# Patient Record
Sex: Female | Born: 1960 | Race: Black or African American | Hispanic: No | Smoking: Never smoker
Health system: Southern US, Community
[De-identification: ages and names within clinical notes are randomized; demographics above are authoritative.]

## PROBLEM LIST (undated history)

## (undated) DIAGNOSIS — E119 Type 2 diabetes mellitus without complications: Secondary | ICD-10-CM

## (undated) DIAGNOSIS — F32A Depression, unspecified: Secondary | ICD-10-CM

## (undated) DIAGNOSIS — E7800 Pure hypercholesterolemia, unspecified: Secondary | ICD-10-CM

## (undated) DIAGNOSIS — K219 Gastro-esophageal reflux disease without esophagitis: Secondary | ICD-10-CM

## (undated) HISTORY — DX: Type 2 diabetes mellitus without complications: E11.9

## (undated) HISTORY — PX: COLONOSCOPY: SHX174

## (undated) HISTORY — DX: Depression, unspecified: F32.A

---

## 2005-12-17 ENCOUNTER — Emergency Department: Payer: Self-pay | Admitting: General Practice

## 2007-08-08 ENCOUNTER — Emergency Department: Payer: Self-pay | Admitting: Unknown Physician Specialty

## 2007-09-02 ENCOUNTER — Emergency Department: Payer: Self-pay | Admitting: Emergency Medicine

## 2007-09-19 ENCOUNTER — Emergency Department: Payer: Self-pay | Admitting: Emergency Medicine

## 2008-08-05 ENCOUNTER — Ambulatory Visit: Payer: Self-pay | Admitting: Obstetrics and Gynecology

## 2010-03-11 ENCOUNTER — Ambulatory Visit: Payer: Self-pay | Admitting: Obstetrics and Gynecology

## 2011-03-08 ENCOUNTER — Ambulatory Visit: Payer: Self-pay | Admitting: Rheumatology

## 2011-03-17 ENCOUNTER — Ambulatory Visit: Payer: Self-pay | Admitting: Internal Medicine

## 2011-09-18 ENCOUNTER — Ambulatory Visit: Payer: Self-pay

## 2012-04-11 ENCOUNTER — Ambulatory Visit: Payer: Self-pay | Admitting: Internal Medicine

## 2013-03-06 ENCOUNTER — Ambulatory Visit: Payer: Self-pay | Admitting: Family Medicine

## 2013-04-20 ENCOUNTER — Ambulatory Visit: Payer: Self-pay | Admitting: Internal Medicine

## 2013-11-06 ENCOUNTER — Ambulatory Visit: Payer: Self-pay | Admitting: Physician Assistant

## 2015-03-06 ENCOUNTER — Ambulatory Visit
Admit: 2015-03-06 | Disposition: A | Discharge status: Home / Self Care | Payer: Self-pay | Attending: Internal Medicine | Admitting: Internal Medicine

## 2016-01-02 ENCOUNTER — Other Ambulatory Visit: Payer: Self-pay | Admitting: Internal Medicine

## 2016-01-02 DIAGNOSIS — Z1231 Encounter for screening mammogram for malignant neoplasm of breast: Secondary | ICD-10-CM

## 2016-03-08 ENCOUNTER — Ambulatory Visit: Payer: No Typology Code available for payment source | Attending: Internal Medicine

## 2016-04-27 ENCOUNTER — Ambulatory Visit: Admission: RE | Admit: 2016-04-27 | Payer: No Typology Code available for payment source | Source: Ambulatory Visit

## 2017-01-25 ENCOUNTER — Other Ambulatory Visit: Payer: Self-pay | Admitting: Internal Medicine

## 2017-01-25 DIAGNOSIS — Z1231 Encounter for screening mammogram for malignant neoplasm of breast: Secondary | ICD-10-CM

## 2017-03-09 ENCOUNTER — Ambulatory Visit
Admission: RE | Admit: 2017-03-09 | Discharge: 2017-03-09 | Disposition: A | Discharge status: Home / Self Care | Payer: No Typology Code available for payment source | Source: Ambulatory Visit | Attending: Internal Medicine | Admitting: Internal Medicine

## 2017-03-09 DIAGNOSIS — Z1231 Encounter for screening mammogram for malignant neoplasm of breast: Secondary | ICD-10-CM

## 2017-12-29 DIAGNOSIS — E1142 Type 2 diabetes mellitus with diabetic polyneuropathy: Secondary | ICD-10-CM | POA: Insufficient documentation

## 2017-12-29 DIAGNOSIS — E559 Vitamin D deficiency, unspecified: Secondary | ICD-10-CM | POA: Insufficient documentation

## 2017-12-29 DIAGNOSIS — E785 Hyperlipidemia, unspecified: Secondary | ICD-10-CM

## 2017-12-29 DIAGNOSIS — Z794 Long term (current) use of insulin: Secondary | ICD-10-CM

## 2017-12-29 DIAGNOSIS — E1169 Type 2 diabetes mellitus with other specified complication: Secondary | ICD-10-CM | POA: Insufficient documentation

## 2018-02-11 ENCOUNTER — Ambulatory Visit
Admission: EM | Admit: 2018-02-11 | Discharge: 2018-02-11 | Disposition: A | Discharge status: Home / Self Care | Payer: No Typology Code available for payment source | Attending: Emergency Medicine | Admitting: Emergency Medicine

## 2018-02-11 ENCOUNTER — Other Ambulatory Visit: Payer: Self-pay

## 2018-02-11 DIAGNOSIS — B029 Zoster without complications: Secondary | ICD-10-CM | POA: Diagnosis not present

## 2018-02-11 DIAGNOSIS — R21 Rash and other nonspecific skin eruption: Secondary | ICD-10-CM | POA: Diagnosis not present

## 2018-02-11 HISTORY — DX: Type 2 diabetes mellitus without complications: E11.9

## 2018-02-11 HISTORY — DX: Pure hypercholesterolemia, unspecified: E78.00

## 2018-02-11 HISTORY — DX: Gastro-esophageal reflux disease without esophagitis: K21.9

## 2018-02-11 MED ORDER — VALACYCLOVIR HCL 1 G PO TABS: 1000.0000 mg | ORAL_TABLET | Freq: Three times a day (TID) | ORAL | 0 refills | Status: AC

## 2018-02-11 MED ORDER — LIDOCAINE 5 % EX OINT: 1.0000 "application " | TOPICAL_OINTMENT | CUTANEOUS | 0 refills | Status: DC | PRN

## 2018-02-11 NOTE — ED Provider Notes (Signed)
[facility]    CSN: [id number] Arrival date & time: 02/11/18  1018     History   Chief Complaint Chief Complaint  Patient presents with  . Rash    HPI Kristin Cruz is a 57 y.o. female.   HPI  57 year old female who presents with a rash that is painful in the left upper back started about 1 week ago.  The pain has been radiating into her left axilla.  Had pain in her leg her neck which are not associated.  She has had chickenpox in childhood along with the other childhood illnesses.  Has had no fever or chills.  Does have a history of diabetes.  She has no respiratory symptoms.       Past Medical History:  Diagnosis Date  . Diabetes mellitus without complication (HCC)   . GERD (gastroesophageal reflux disease)   . High cholesterol     There are no active problems to display for this patient.   History reviewed. No pertinent surgical history.  OB History   None      Home Medications    Prior to Admission medications   Medication Sig Start Date End Date Taking? Authorizing Provider  insulin degludec (TRESIBA FLEXTOUCH) 100 UNIT/ML SOPN FlexTouch Pen Inject into the skin. 12/29/17  Yes [provider]  metFORMIN (GLUCOPHAGE) 500 MG tablet Take by mouth 2 (two) times daily with a meal.   Yes [provider]  rosuvastatin (CRESTOR) 10 MG tablet Take by mouth. 12/29/17  Yes [provider]  aspirin 81 MG chewable tablet Chew by mouth.    [provider]  glimepiride (AMARYL) 4 MG tablet Take by mouth.    [provider]  lidocaine (XYLOCAINE) 5 % ointment Apply 1 application topically as needed. 02/11/18   Lutricia Feiloemer, Brad Mcgaughy P, PA-C  valACYclovir (VALTREX) 1000 MG tablet Take 1 tablet (1,000 mg total) by mouth 3 (three) times daily for 7 days. 02/11/18 02/18/18  Lutricia Feiloemer, Penina Reisner P, PA-C    Family History Family History  Problem Relation Age of Onset  . Breast cancer Neg Hx     Social History Social  History   Tobacco Use  . Smoking status: Never Smoker  . Smokeless tobacco: Current User    Types: Snuff  Substance Use Topics  . Alcohol use: Not Currently  . Drug use: Never     Allergies   Patient has no known allergies.   Review of Systems Review of Systems  Constitutional: Positive for activity change. Negative for chills, fatigue and fever.  Skin: Positive for rash.  All other systems reviewed and are negative.    Physical Exam Triage Vital Signs ED Triage Vitals  Enc Vitals Group     BP 02/11/18 1040 115/66     Pulse Rate 02/11/18 1040 92     Resp 02/11/18 1040 16     Temp 02/11/18 1040 98.1 F (36.7 C)     Temp Source 02/11/18 1040 Oral     SpO2 02/11/18 1040 100 %     Weight 02/11/18 1041 121 lb (54.9 kg)     Height 02/11/18 1041 5' 1.25" (1.556 m)     Head Circumference --      Peak Flow --      Pain Score 02/11/18 1041 8     Pain Loc --      Pain Edu? --      Excl. in GC? --    No data found.  Updated Vital  Signs BP 115/66 (BP Location: Right Arm)   Pulse 92   Temp 98.1 F (36.7 C) (Oral)   Resp 16   Ht 5' 1.25" (1.556 m)   Wt 121 lb (54.9 kg)   SpO2 100%   BMI 22.68 kg/m   Visual Acuity Right Eye Distance:   Left Eye Distance:   Bilateral Distance:    Right Eye Near:   Left Eye Near:    Bilateral Near:     Physical Exam  Constitutional: She is oriented to person, place, and time. She appears well-developed and well-nourished. No distress.  HENT:  Head: Normocephalic.  Eyes: Pupils are equal, round, and reactive to light. Right eye exhibits no discharge. Left eye exhibits no discharge.  Neck: Normal range of motion. Neck supple.  Musculoskeletal: Normal range of motion. She exhibits edema and tenderness.  Neurological: She is alert and oriented to person, place, and time.  Skin: Skin is warm and dry. Rash noted. She is not diaphoretic.  Psychiatric: She has a normal mood and affect. Her behavior is normal. Judgment and thought  content normal.  Nursing note and vitals reviewed.        UC Treatments / Results  Labs (all labs ordered are listed, but only abnormal results are displayed) Labs Reviewed - No data to display  EKG None Radiology No results found.  Procedures Procedures (including critical care time)  Medications Ordered in UC Medications - No data to display   Initial Impression / Assessment and Plan / UC Course  I have reviewed the triage vital signs and the nursing notes.  Pertinent labs & imaging results that were available during my care of the patient were reviewed by me and considered in my medical decision making (see chart for details).     Plan: 1. Test/x-ray results and diagnosis reviewed with patient 2. rx as per orders; risks, benefits, potential side effects reviewed with patient 3. Recommend supportive treatment with use of tracts even though it is out of the window effort to try to prevent postherpetic neuralgia.  Given her lidocaine 5% ointment that she can apply to the area for topical pain relief.  Also recommend that she take Tylenol or Motrin for pain control.  I recommended she follow-up in 1 week with her primary care physician for ongoing care and monitoring. 4. F/u prn if symptoms worsen or don't improve   Final Clinical Impressions(s) / UC Diagnoses   Final diagnoses:  Herpes zoster without complication    ED Discharge Orders        Ordered    valACYclovir (VALTREX) 1000 MG tablet  3 times daily     02/11/18 1154    lidocaine (XYLOCAINE) 5 % ointment  As needed     02/11/18 1155       Controlled Substance Prescriptions [place] Controlled Substance Registry consulted? Not Applicable   Lutricia Feil, PA-C 02/11/18 1201

## 2018-02-11 NOTE — ED Triage Notes (Signed)
Painful left upper back rash starting last Saturday

## 2018-02-23 ENCOUNTER — Other Ambulatory Visit: Payer: Self-pay | Admitting: Nurse Practitioner

## 2018-02-23 DIAGNOSIS — Z1231 Encounter for screening mammogram for malignant neoplasm of breast: Secondary | ICD-10-CM

## 2018-03-13 ENCOUNTER — Ambulatory Visit: Payer: No Typology Code available for payment source

## 2018-03-14 ENCOUNTER — Inpatient Hospital Stay: Admission: RE | Admit: 2018-03-14 | Payer: No Typology Code available for payment source | Source: Ambulatory Visit

## 2018-03-20 ENCOUNTER — Inpatient Hospital Stay: Admission: RE | Admit: 2018-03-20 | Payer: No Typology Code available for payment source | Source: Ambulatory Visit

## 2018-03-27 ENCOUNTER — Other Ambulatory Visit: Payer: Self-pay | Admitting: Nurse Practitioner

## 2018-03-27 ENCOUNTER — Ambulatory Visit
Admission: RE | Admit: 2018-03-27 | Discharge: 2018-03-27 | Disposition: A | Discharge status: Home / Self Care | Payer: No Typology Code available for payment source | Source: Ambulatory Visit | Attending: Nurse Practitioner | Admitting: Nurse Practitioner

## 2018-03-27 DIAGNOSIS — Z1231 Encounter for screening mammogram for malignant neoplasm of breast: Secondary | ICD-10-CM

## 2018-05-27 ENCOUNTER — Other Ambulatory Visit: Payer: Self-pay

## 2018-05-27 ENCOUNTER — Ambulatory Visit
Admission: EM | Admit: 2018-05-27 | Discharge: 2018-05-27 | Disposition: A | Discharge status: Home / Self Care | Payer: No Typology Code available for payment source | Attending: Emergency Medicine | Admitting: Emergency Medicine

## 2018-05-27 DIAGNOSIS — R350 Frequency of micturition: Secondary | ICD-10-CM

## 2018-05-27 DIAGNOSIS — B373 Candidiasis of vulva and vagina: Secondary | ICD-10-CM | POA: Insufficient documentation

## 2018-05-27 DIAGNOSIS — Z7982 Long term (current) use of aspirin: Secondary | ICD-10-CM | POA: Insufficient documentation

## 2018-05-27 DIAGNOSIS — R358 Other polyuria: Secondary | ICD-10-CM | POA: Diagnosis not present

## 2018-05-27 DIAGNOSIS — Z7984 Long term (current) use of oral hypoglycemic drugs: Secondary | ICD-10-CM | POA: Insufficient documentation

## 2018-05-27 DIAGNOSIS — B3731 Acute candidiasis of vulva and vagina: Secondary | ICD-10-CM

## 2018-05-27 DIAGNOSIS — E119 Type 2 diabetes mellitus without complications: Secondary | ICD-10-CM | POA: Diagnosis not present

## 2018-05-27 DIAGNOSIS — E1165 Type 2 diabetes mellitus with hyperglycemia: Secondary | ICD-10-CM | POA: Diagnosis not present

## 2018-05-27 DIAGNOSIS — R631 Polydipsia: Secondary | ICD-10-CM | POA: Diagnosis not present

## 2018-05-27 DIAGNOSIS — Z79899 Other long term (current) drug therapy: Secondary | ICD-10-CM | POA: Insufficient documentation

## 2018-05-27 DIAGNOSIS — K219 Gastro-esophageal reflux disease without esophagitis: Secondary | ICD-10-CM | POA: Insufficient documentation

## 2018-05-27 LAB — URINALYSIS, COMPLETE (UACMP) WITH MICROSCOPIC
Bacteria, UA: NONE SEEN
Bilirubin Urine: NEGATIVE
Glucose, UA: >1000 mg/dL — AB
HGB URINE DIPSTICK: NEGATIVE
Ketones, ur: NEGATIVE mg/dL
NITRITE: NEGATIVE
PH: 5.5 (ref 5.0–8.0)
PROTEIN: NEGATIVE mg/dL
RBC / HPF: NONE SEEN RBC/hpf (ref 0–5)
SPECIFIC GRAVITY, URINE: 1.01 (ref 1.005–1.030)

## 2018-05-27 LAB — COMPREHENSIVE METABOLIC PANEL
ALBUMIN: 4.2 g/dL (ref 3.5–5.0)
ALT: 17 U/L (ref 0–44)
AST: 21 U/L (ref 15–41)
Alkaline Phosphatase: 98 U/L (ref 38–126)
Anion gap: 13 (ref 5–15)
BUN: 10 mg/dL (ref 6–20)
CO2: 23 mmol/L (ref 22–32)
Calcium: 9.2 mg/dL (ref 8.9–10.3)
Chloride: 96 mmol/L — ABNORMAL LOW (ref 98–111)
Creatinine, Ser: 0.61 mg/dL (ref 0.44–1.00)
GFR calc Af Amer: >60 mL/min (ref >60)
GFR calc non Af Amer: >60 mL/min (ref >60)
GLUCOSE: 351 mg/dL — AB (ref 70–99)
Potassium: 3.8 mmol/L (ref 3.5–5.1)
Sodium: 132 mmol/L — ABNORMAL LOW (ref 135–145)
Total Bilirubin: 1.5 mg/dL — ABNORMAL HIGH (ref 0.3–1.2)
Total Protein: 7.7 g/dL (ref 6.5–8.1)

## 2018-05-27 LAB — GLUCOSE, CAPILLARY: Glucose-Capillary: 330 mg/dL — ABNORMAL HIGH (ref 70–99)

## 2018-05-27 MED ORDER — FLUCONAZOLE 150 MG PO TABS: 150.0000 mg | ORAL_TABLET | Freq: Once | ORAL | 1 refills | Status: AC

## 2018-05-27 NOTE — Discharge Instructions (Addendum)
Your labs came back okay, except for the yeast in your urine and the high blood sugar.  Increase your metformin by adding 500 mg of metformin in the night with dinner.  Continue the thousand milligrams of metformin in the morning.  I am also treating you for a vaginal yeast infection With Diflucan. call your endocrinologist on Monday and get an appointment as soon as you possibly can.  I am have sent off a hemoglobin A1c as a courtesy, he should be able to see these results in epic.  Go immediately to the ER if your sugar reads high or if it is unreadable in your glucose meter, chest pain, chest pressure or heaviness, or any other concerns

## 2018-05-27 NOTE — ED Triage Notes (Signed)
Pt is c/o "sweet smelling" urine x 1 month. She has DM, and [facility]. Denies recent DM med changes. Also c/o SOB, weakness, fatigue.

## 2018-05-27 NOTE — ED Provider Notes (Signed)
HPI  SUBJECTIVE:  Kristin Cruz is a 57 y.o. female who presents with "sweet smelling urine" for the past month.  She reports urinary frequency, polyuria, polydipsia during this time.  She denies dysuria, urgency, cloudy urine, hematuria.  No unintentional weight loss, blurry vision, abdominal, back, pelvic pain.  No fevers, nausea, body aches.  She also reports vaginal itching and nonodorous vaginal discharge.  No aggravating or alleviating factors.  She has not tried anything for her symptoms.  She has not checked her sugars in 5 days, states that it was in the 300s.  She states that her baseline is somewhere between 300-400 and has been this way for "years".  She states that she is not sure how to take her insulin. Patient has a past medical history of diabetes, on metformin 1000 milligrams once daily, glimepiride, 30 units of Tresiba insulin daily.  She also has a past medical history of lower extremity neuropathy, GERD.  No history of DKA, HHNK, UTI, pyelonephritis. No record of PCP visit 2 months ago.  Last visit was with endocrinology in February. PMD: Nira Retort.  Endocrinologist: Dr. Gershon Crane.  Past Medical History:  Diagnosis Date  . Diabetes mellitus without complication (HCC)   . GERD (gastroesophageal reflux disease)   . High cholesterol     History reviewed. No pertinent surgical history.  Family History  Problem Relation Age of Onset  . Breast cancer Neg Hx     Social History   Tobacco Use  . Smoking status: Never Smoker  . Smokeless tobacco: Current User    Types: Snuff  Substance Use Topics  . Alcohol use: Not Currently  . Drug use: Never    No current facility-administered medications for this encounter.   Current Outpatient Medications:  .  aspirin 81 MG chewable tablet, Chew by mouth., Disp: , Rfl:  .  glimepiride (AMARYL) 4 MG tablet, Take by mouth., Disp: , Rfl:  .  insulin degludec (TRESIBA FLEXTOUCH) 100 UNIT/ML SOPN FlexTouch Pen,  Inject into the skin., Disp: , Rfl:  .  metFORMIN (GLUCOPHAGE) 500 MG tablet, Take by mouth 2 (two) times daily with a meal., Disp: , Rfl:  .  omeprazole (PRILOSEC) 40 MG capsule, TAKE 1 TABLET ONCE A DAY 30 MINUTES BEFORE MEAL, Disp: , Rfl: 3 .  rosuvastatin (CRESTOR) 10 MG tablet, Take by mouth., Disp: , Rfl:  .  Vitamin D, Ergocalciferol, (DRISDOL) 50000 units CAPS capsule, TAKE ONE CAPSULE BY MOUTH WEEKLY FOR LOW VITAMIN D, Disp: , Rfl: 3 .  lidocaine (XYLOCAINE) 5 % ointment, Apply 1 application topically as needed., Disp: 35.44 g, Rfl: 0  No Known Allergies   ROS  As noted in HPI.   Physical Exam  BP 112/64 (BP Location: Left Arm)   Pulse 90   Temp 98.3 F (36.8 C) (Oral)   Resp 16   Ht 5' 1.25" (1.556 m)   Wt 123 lb (55.8 kg)   SpO2 100%   BMI 23.05 kg/m   Constitutional: Well developed, well nourished, no acute distress Eyes:  EOMI, conjunctiva normal bilaterally HENT: Normocephalic, atraumatic,mucus membranes moist Respiratory: Normal inspiratory effort lungs clear bilaterally Cardiovascular: Normal rate regular rhythm, no murmurs, rubs, gallops GI: nondistended soft, nontender.  Active bowel sounds.  No guarding, rebound.  No suprapubic or flank tenderness Back: No CVA tenderness skin: No rash, skin intact Musculoskeletal: no deformities Neurologic: Alert & oriented x 3, no focal neuro deficits Psychiatric: Speech and behavior appropriate   ED Course   Medications -  No data to display  Orders Placed This Encounter  Procedures  . Urine culture    Standing Status:   Standing    Number of Occurrences:   1  . Urinalysis, Complete w Microscopic    Standing Status:   Standing    Number of Occurrences:   1  . Glucose, capillary    Standing Status:   Standing    Number of Occurrences:   1  . Comprehensive metabolic panel    Standing Status:   Standing    Number of Occurrences:   1  . Hemoglobin A1c    Standing Status:   Standing    Number of Occurrences:    1    Results for orders placed or performed during the hospital encounter of 05/27/18 (from the past 24 hour(s))  Urinalysis, Complete w Microscopic     Status: Abnormal   Collection Time: 05/27/18  8:20 AM  Result Value Ref Range   Color, Urine YELLOW YELLOW   APPearance CLOUDY (A) CLEAR   Specific Gravity, Urine 1.010 1.005 - 1.030   pH 5.5 5.0 - 8.0   Glucose, UA >1000 (A) NEGATIVE mg/dL   Hgb urine dipstick NEGATIVE NEGATIVE   Bilirubin Urine NEGATIVE NEGATIVE   Ketones, ur NEGATIVE NEGATIVE mg/dL   Protein, ur NEGATIVE NEGATIVE mg/dL   Nitrite NEGATIVE NEGATIVE   Leukocytes, UA SMALL (A) NEGATIVE   Squamous Epithelial / LPF 6-10 0 - 5   WBC, UA 6-10 0 - 5 WBC/hpf   RBC / HPF NONE SEEN 0 - 5 RBC/hpf   Bacteria, UA NONE SEEN NONE SEEN   Budding Yeast 3+   Glucose, capillary     Status: Abnormal   Collection Time: 05/27/18  8:32 AM  Result Value Ref Range   Glucose-Capillary 330 (H) 70 - 99 mg/dL  Comprehensive metabolic panel     Status: Abnormal   Collection Time: 05/27/18  8:51 AM  Result Value Ref Range   Sodium 132 (L) 135 - 145 mmol/L   Potassium 3.8 3.5 - 5.1 mmol/L   Chloride 96 (L) 98 - 111 mmol/L   CO2 23 22 - 32 mmol/L   Glucose, Bld 351 (H) 70 - 99 mg/dL   BUN 10 6 - 20 mg/dL   Creatinine, Ser 9.56 0.44 - 1.00 mg/dL   Calcium 9.2 8.9 - 21.3 mg/dL   Total Protein 7.7 6.5 - 8.1 g/dL   Albumin 4.2 3.5 - 5.0 g/dL   AST 21 15 - 41 U/L   ALT 17 0 - 44 U/L   Alkaline Phosphatase 98 38 - 126 U/L   Total Bilirubin 1.5 (H) 0.3 - 1.2 mg/dL   GFR calc non Af Amer >60 >60 mL/min   GFR calc Af Amer >60 >60 mL/min   Anion gap 13 5 - 15   No results found.  ED Clinical Impression  No diagnosis found.   ED Assessment/Plan  Outside records reviewed.  As noted in HPI.  His primary complaint was sweet smelling urine, polyuria polydipsia. Also c/o vaginal itching, d/c.  She did not mention any other complaints to me.  Fingerstick 330.  We will check a UA, CMP,  make sure that she is not acidotic, and and A1c as courtesy to her endocrinologist.    UA with over thousand glucose, small leukocytes, but it is a contaminated specimen.  Also 3+ yeast.  Sending urine off for culture to confirm absence of UTI and will base treatment decisions on the  culture.  Other than the Hyperglycemia, CMP normal.  Normal anion gap.  She is not in DKA.  1.  Uncontrolled diabetes.  Will add an additional 500 mg of metformin at night.  She is to continue thousand milligrams of metformin in the morning.  States that she does not need a prescription of metformin.  She states that she will call her endocrinologist on Monday for further management.  2.  Vaginal yeast infection.  Sending home with Diflucan.  Discussed labs, MDM, treatment plan, and plan for follow-up with patient. Discussed sn/sx that should prompt return to the ED. patient agrees with plan.   No orders of the defined types were placed in this encounter.   *This clinic note was created using Dragon dictation software. Therefore, there may be occasional mistakes despite careful proofreading.   ?   Domenick GongMortenson, Minyon Billiter, MD 05/27/18 1720

## 2018-05-28 LAB — HEMOGLOBIN A1C
Hgb A1c MFr Bld: 9.6 % — ABNORMAL HIGH (ref 4.8–5.6)
MEAN PLASMA GLUCOSE: 228.82 mg/dL

## 2018-05-29 LAB — URINE CULTURE

## 2018-05-30 ENCOUNTER — Telehealth (HOSPITAL_COMMUNITY): Payer: Self-pay

## 2018-05-30 NOTE — Telephone Encounter (Signed)
A1C is elevated. Per Dr. Tracie HarrierHagler patient should follow up with PCP. Attempted to reach patient. No answer at this time.

## 2018-06-08 ENCOUNTER — Other Ambulatory Visit: Payer: Self-pay

## 2018-06-08 ENCOUNTER — Ambulatory Visit (INDEPENDENT_AMBULATORY_CARE_PROVIDER_SITE_OTHER): Payer: No Typology Code available for payment source | Admitting: Family Medicine

## 2018-06-08 ENCOUNTER — Encounter: Payer: Self-pay | Admitting: Family Medicine

## 2018-06-08 VITALS — BP 92/68 | HR 91 | Resp 16 | Ht 61.25 in | Wt 124.0 lb

## 2018-06-08 DIAGNOSIS — Z794 Long term (current) use of insulin: Secondary | ICD-10-CM | POA: Diagnosis not present

## 2018-06-08 DIAGNOSIS — E1169 Type 2 diabetes mellitus with other specified complication: Secondary | ICD-10-CM

## 2018-06-08 DIAGNOSIS — E1142 Type 2 diabetes mellitus with diabetic polyneuropathy: Secondary | ICD-10-CM | POA: Diagnosis not present

## 2018-06-08 DIAGNOSIS — E785 Hyperlipidemia, unspecified: Secondary | ICD-10-CM

## 2018-06-08 DIAGNOSIS — Z7689 Persons encountering health services in other specified circumstances: Secondary | ICD-10-CM

## 2018-06-08 DIAGNOSIS — E782 Mixed hyperlipidemia: Secondary | ICD-10-CM

## 2018-06-08 MED ORDER — METFORMIN HCL 500 MG PO TABS: 500.0000 mg | ORAL_TABLET | Freq: Two times a day (BID) | ORAL | 3 refills | Status: DC

## 2018-06-08 MED ORDER — GLIMEPIRIDE 4 MG PO TABS: 4.0000 mg | ORAL_TABLET | ORAL | 3 refills | Status: DC

## 2018-06-08 MED ORDER — ROSUVASTATIN CALCIUM 10 MG PO TABS: 10.0000 mg | ORAL_TABLET | Freq: Every day | ORAL | 3 refills | Status: DC

## 2018-06-08 MED ORDER — INSULIN DEGLUDEC 100 UNIT/ML ~~LOC~~ SOPN: 30.0000 [IU] | PEN_INJECTOR | Freq: Every day | SUBCUTANEOUS | 3 refills | Status: DC

## 2018-06-08 NOTE — Addendum Note (Signed)
Addended by: Duanne LimerickJONES, Donnie Panik C on: 06/08/2018 04:28 PM   Modules accepted: Level of Service

## 2018-06-08 NOTE — Progress Notes (Addendum)
Name: Kristin Cruz   MRN: [id number]    DOB: September 08, 1961   Date:06/08/2018       Progress Note  Subjective  Chief Complaint  Chief Complaint  Patient presents with  . Establish Care    Was seeing [facility] End[facility] and did not like him. Did not mesh well. BS ranges in 200-220s Low 63 Tuesday     Diabetes  She presents for her follow-up diabetic visit. She has type 2 diabetes mellitus. Her disease course has been fluctuating. Pertinent negatives for hypoglycemia include no confusion, dizziness, headaches, hunger, mood changes, nervousness/anxiousness, pallor, seizures, sleepiness, speech difficulty or sweats. Associated symptoms include polydipsia and polyuria. Pertinent negatives for diabetes include no blurred vision, no chest pain, no fatigue, no foot paresthesias, no foot ulcerations, no polyphagia, no visual change, no weakness and no weight loss. There are no hypoglycemic complications. Symptoms are improving. There are no diabetic complications. Risk factors for coronary artery disease include diabetes mellitus and dyslipidemia. Current diabetic treatment includes oral agent (dual therapy) and insulin injections. She is following a generally healthy diet. Her home blood glucose trend is fluctuating minimally. An ACE inhibitor/angiotensin II receptor blocker is not being taken.  Hyperlipidemia  This is a chronic problem. The problem is uncontrolled. Exacerbating diseases include diabetes. She has no history of chronic renal disease. Pertinent negatives include no chest pain, focal sensory loss, focal weakness, leg pain, myalgias or shortness of breath. Current antihyperlipidemic treatment includes statins. The current treatment provides moderate improvement of lipids. There are no compliance problems.  Risk factors for coronary artery disease include dyslipidemia and diabetes mellitus.    Type 2 diabetes mellitus with diabetic polyneuropathy, with long-term current use of insulin  (HCC) Resumed glimepiride 4mg  daily, metformen 500 bid, and tesiba 30 units. rechek in 3 weeks.  Hyperlipidemia due to type 2 diabetes mellitus (HCC) Continue rosuvastatin 10 mg daily    Past Medical History:  Diagnosis Date  . Diabetes mellitus without complication (HCC)   . GERD (gastroesophageal reflux disease)   . High cholesterol     History reviewed. No pertinent surgical history.  Family History  Problem Relation Age of Onset  . Breast cancer Neg Hx     Social History   Socioeconomic History  . Marital status: Legally Separated    Spouse name: Not on file  . Number of children: Not on file  . Years of education: Not on file  . Highest education level: Not on file  Occupational History  . Not on file  Social Needs  . Financial resource strain: Not on file  . Food insecurity:    Worry: Not on file    Inability: Not on file  . Transportation needs:    Medical: Not on file    Non-medical: Not on file  Tobacco Use  . Smoking status: Never Smoker  . Smokeless tobacco: Current User    Types: Snuff  Substance and Sexual Activity  . Alcohol use: Not Currently  . Drug use: Never  . Sexual activity: Not on file  Lifestyle  . Physical activity:    Days per week: Not on file    Minutes per session: Not on file  . Stress: Not on file  Relationships  . Social connections:    Talks on phone: Not on file    Gets together: Not on file    Attends religious service: Not on file    Active member of club or organization: Not on file  Attends meetings of clubs or organizations: Not on file    Relationship status: Not on file  . Intimate partner violence:    Fear of current or ex partner: Not on file    Emotionally abused: Not on file    Physically abused: Not on file    Forced sexual activity: Not on file  Other Topics Concern  . Not on file  Social History Narrative  . Not on file    No Known Allergies  Outpatient Medications Prior to Visit  Medication Sig  Dispense Refill  . aspirin 81 MG chewable tablet Chew by mouth.    Marland Kitchen. FREESTYLE INSULINX TEST test strip USE WITH DEVICE TO CHECK SUGARS TWICE DAILY  3  . metFORMIN (GLUCOPHAGE-XR) 500 MG 24 hr tablet Take 1,000 mg by mouth.     . naproxen sodium (ANAPROX) 550 MG tablet   0  . omeprazole (PRILOSEC) 20 MG capsule Take by mouth.    Marland Kitchen. omeprazole (PRILOSEC) 40 MG capsule TAKE 1 TABLET ONCE A DAY 30 MINUTES BEFORE MEAL  3  . Vitamin D, Ergocalciferol, (DRISDOL) 50000 units CAPS capsule TAKE ONE CAPSULE BY MOUTH WEEKLY FOR LOW VITAMIN D  3  . gabapentin (NEURONTIN) 300 MG capsule Take 300 mg by mouth.   0  . glimepiride (AMARYL) 4 MG tablet Take by mouth.    . insulin degludec (TRESIBA FLEXTOUCH) 100 UNIT/ML SOPN FlexTouch Pen Inject 30 Units into the skin.     . metFORMIN (GLUCOPHAGE) 500 MG tablet Take by mouth 2 (two) times daily with a meal.    . rosuvastatin (CRESTOR) 10 MG tablet Take 10 mg by mouth daily.     . fluconazole (DIFLUCAN) 150 MG tablet TAKE 1 TABLET ONCE FOR 1 DOSE...MAY REPEAT IN 72 HOURS IF NO IMPROVEMENT  1  . lidocaine (XYLOCAINE) 5 % ointment Apply 1 application topically as needed. 35.44 g 0   No facility-administered medications prior to visit.     Review of Systems  Constitutional: Negative for chills, fatigue, fever, malaise/fatigue and weight loss.  HENT: Negative for ear discharge, ear pain and sore throat.   Eyes: Negative for blurred vision.  Respiratory: Negative for cough, sputum production, shortness of breath and wheezing.   Cardiovascular: Negative for chest pain, palpitations and leg swelling.  Gastrointestinal: Negative for abdominal pain, blood in stool, constipation, diarrhea, heartburn, melena and nausea.  Genitourinary: Negative for dysuria, frequency, hematuria and urgency.  Musculoskeletal: Negative for back pain, joint pain, myalgias and neck pain.  Skin: Negative for pallor and rash.  Neurological: Negative for dizziness, tingling, sensory change,  focal weakness, seizures, speech difficulty, weakness and headaches.  Endo/Heme/Allergies: Positive for polydipsia. Negative for environmental allergies and polyphagia. Does not bruise/bleed easily.  Psychiatric/Behavioral: Negative for confusion, depression and suicidal ideas. The patient is not nervous/anxious and does not have insomnia.      Objective  Vitals:   06/08/18 1507  BP: 92/68  Pulse: 91  Resp: 16  SpO2: 98%  Weight: 124 lb (56.2 kg)  Height: 5' 1.25" (1.556 m)    Physical Exam  Constitutional: She is oriented to person, place, and time. She appears well-developed and well-nourished.  HENT:  Head: Normocephalic.  Right Ear: External ear normal.  Left Ear: External ear normal.  Mouth/Throat: Oropharynx is clear and moist.  Eyes: Pupils are equal, round, and reactive to light. Conjunctivae and EOM are normal. Lids are everted and swept, no foreign bodies found. Left eye exhibits no hordeolum. No foreign body present  in the left eye. Right conjunctiva is not injected. Left conjunctiva is not injected. No scleral icterus.  Neck: Normal range of motion. Neck supple. No JVD present. No tracheal deviation present. No thyromegaly present.  Cardiovascular: Normal rate, regular rhythm, normal heart sounds and intact distal pulses. Exam reveals no gallop and no friction rub.  No murmur heard. Pulmonary/Chest: Effort normal and breath sounds normal. No respiratory distress. She has no wheezes. She has no rales.  Abdominal: Soft. Bowel sounds are normal. She exhibits no mass. There is no hepatosplenomegaly. There is no tenderness. There is no rebound and no guarding.  Musculoskeletal: Normal range of motion. She exhibits no edema or tenderness.  Lymphadenopathy:    She has no cervical adenopathy.  Neurological: She is alert and oriented to person, place, and time. She has normal strength. She displays normal reflexes. No cranial nerve deficit.  Skin: Skin is warm. No rash noted.   Psychiatric: She has a normal mood and affect. Her mood appears not anxious. She does not exhibit a depressed mood.  Nursing note and vitals reviewed.     Assessment & Plan  Problem List Items Addressed This Visit      Endocrine   Hyperlipidemia due to type 2 diabetes mellitus (HCC)    Continue rosuvastatin 10 mg daily       Type 2 diabetes mellitus with diabetic polyneuropathy, with long-term current use of insulin (HCC)    Resumed glimepiride 4mg  daily, metformen 500 bid, and tesiba 30 units. rechek in 3 weeks.       Other Visit Diagnoses    Establishing care with new doctor, encounter for    -  Primary   establish care with PCP Meds reestablished rechecki in 3 weeks      Meds ordered this encounter  Medications  . DISCONTD: glimepiride (AMARYL) 4 MG tablet    Sig: Take 1 tablet (4 mg total) by mouth 1 day or 1 dose.    Dispense:  30 tablet    Refill:  3  . DISCONTD: metFORMIN (GLUCOPHAGE) 500 MG tablet    Sig: Take 1 tablet (500 mg total) by mouth 2 (two) times daily with a meal.    Dispense:  60 tablet    Refill:  3  . DISCONTD: insulin degludec (TRESIBA FLEXTOUCH) 100 UNIT/ML SOPN FlexTouch Pen    Sig: Inject 0.3 mLs (30 Units total) into the skin daily.    Dispense:  1 pen    Refill:  3  . DISCONTD: rosuvastatin (CRESTOR) 10 MG tablet    Sig: Take 1 tablet (10 mg total) by mouth daily.    Dispense:  30 tablet    Refill:  3      Dr. Hayden [facility]  06/08/18

## 2018-06-08 NOTE — Assessment & Plan Note (Addendum)
Resumed glimepiride 4mg  daily, metformen 500 bid, and tesiba 30 units. rechek in 3 weeks.

## 2018-06-08 NOTE — Assessment & Plan Note (Signed)
Continue rosuvastatin 10 mg daily.

## 2018-06-28 ENCOUNTER — Ambulatory Visit: Payer: No Typology Code available for payment source | Admitting: Family Medicine

## 2018-06-28 ENCOUNTER — Encounter: Payer: Self-pay | Admitting: Family Medicine

## 2018-06-28 VITALS — BP 107/74 | HR 74 | Resp 16 | Ht 61.25 in | Wt 126.0 lb

## 2018-06-28 DIAGNOSIS — E1142 Type 2 diabetes mellitus with diabetic polyneuropathy: Secondary | ICD-10-CM

## 2018-06-28 DIAGNOSIS — Z794 Long term (current) use of insulin: Secondary | ICD-10-CM

## 2018-06-28 NOTE — Progress Notes (Signed)
Name: Kristin Cruz   MRN: [id number]    DOB: [birth year]   Date:06/28/2018       Progress Note  Subjective  Chief Complaint  Chief Complaint  Patient presents with  . Diabetes    BS 100-130 Feeling better with med schedule now but it is very low upon waking around 2-3 PM. Working 3 rd shift. Will see Oconnell 8/20     Diabetes  She presents for her follow-up diabetic visit. She has type 2 diabetes mellitus. Her disease course has been stable. Pertinent negatives for hypoglycemia include no confusion, dizziness, headaches, hunger, mood changes, nervousness/anxiousness, pallor, seizures, sleepiness, speech difficulty, sweats or tremors. Pertinent negatives for diabetes include no blurred vision, no chest pain, no fatigue, no foot paresthesias, no foot ulcerations, no polydipsia, no polyphagia, no polyuria, no visual change, no weakness and no weight loss. There are no hypoglycemic complications. Symptoms are stable. There are no diabetic complications. Risk factors for coronary artery disease include diabetes mellitus and dyslipidemia. She is compliant with treatment most of the time. Her home blood glucose trend is fluctuating minimally. Her breakfast blood glucose range is generally 110-130 mg/dl.    Type 2 diabetes mellitus with diabetic polyneuropathy, with long-term current use of insulin (HCC) Patient compliant with current regimen. Continue Treseba,metformen but will hold glimiperide until resultan A1C obtained,   Past Medical History:  Diagnosis Date  . Diabetes mellitus without complication (HCC)   . GERD (gastroesophageal reflux disease)   . High cholesterol     History reviewed. No pertinent surgical history.  Family History  Problem Relation Age of Onset  . Breast cancer Neg Hx     Social History   Socioeconomic History  . Marital status: Legally Separated    Spouse name: Not on file  . Number of children: Not on file  . Years of education: Not on file  .  Highest education level: Not on file  Occupational History  . Not on file  Social Needs  . Financial resource strain: Not on file  . Food insecurity:    Worry: Not on file    Inability: Not on file  . Transportation needs:    Medical: Not on file    Non-medical: Not on file  Tobacco Use  . Smoking status: Never Smoker  . Smokeless tobacco: Current User    Types: Snuff  Substance and Sexual Activity  . Alcohol use: Not Currently  . Drug use: Never  . Sexual activity: Not on file  Lifestyle  . Physical activity:    Days per week: Not on file    Minutes per session: Not on file  . Stress: Not on file  Relationships  . Social connections:    Talks on phone: Not on file    Gets together: Not on file    Attends religious service: Not on file    Active member of club or organization: Not on file    Attends meetings of clubs or organizations: Not on file    Relationship status: Not on file  . Intimate partner violence:    Fear of current or ex partner: Not on file    Emotionally abused: Not on file    Physically abused: Not on file    Forced sexual activity: Not on file  Other Topics Concern  . Not on file  Social History Narrative  . Not on file    No Known Allergies  Outpatient Medications Prior to Visit  Medication Sig Dispense Refill  .  aspirin 81 MG chewable tablet Chew by mouth.    Marland Kitchen. FREESTYLE INSULINX TEST test strip USE WITH DEVICE TO CHECK SUGARS TWICE DAILY  3  . glimepiride (AMARYL) 4 MG tablet Take 1 tablet (4 mg total) by mouth 1 day or 1 dose. 30 tablet 3  . insulin degludec (TRESIBA FLEXTOUCH) 100 UNIT/ML SOPN FlexTouch Pen Inject 0.3 mLs (30 Units total) into the skin daily. 1 pen 3  . metFORMIN (GLUCOPHAGE) 500 MG tablet Take 1 tablet (500 mg total) by mouth 2 (two) times daily with a meal. 60 tablet 3  . naproxen sodium (ANAPROX) 550 MG tablet   0  . omeprazole (PRILOSEC) 40 MG capsule TAKE 1 TABLET ONCE A DAY 30 MINUTES BEFORE MEAL  3  . rosuvastatin  (CRESTOR) 10 MG tablet Take 1 tablet (10 mg total) by mouth daily. 30 tablet 3  . Vitamin D, Ergocalciferol, (DRISDOL) 50000 units CAPS capsule TAKE ONE CAPSULE BY MOUTH WEEKLY FOR LOW VITAMIN D  3  . metFORMIN (GLUCOPHAGE-XR) 500 MG 24 hr tablet Take 1,000 mg by mouth.     Marland Kitchen. omeprazole (PRILOSEC) 20 MG capsule Take by mouth.     No facility-administered medications prior to visit.     Review of Systems  Constitutional: Negative for chills, fatigue, fever, malaise/fatigue and weight loss.  HENT: Negative for ear discharge, ear pain and sore throat.   Eyes: Negative for blurred vision.  Respiratory: Negative for cough, sputum production, shortness of breath and wheezing.   Cardiovascular: Negative for chest pain, palpitations and leg swelling.  Gastrointestinal: Negative for abdominal pain, blood in stool, constipation, diarrhea, heartburn, melena and nausea.  Genitourinary: Negative for dysuria, frequency, hematuria and urgency.  Musculoskeletal: Negative for back pain, joint pain, myalgias and neck pain.  Skin: Negative for pallor and rash.  Neurological: Negative for dizziness, tingling, tremors, sensory change, focal weakness, seizures, speech difficulty, weakness and headaches.  Endo/Heme/Allergies: Negative for environmental allergies, polydipsia and polyphagia. Does not bruise/bleed easily.  Psychiatric/Behavioral: Negative for confusion, depression and suicidal ideas. The patient is not nervous/anxious and does not have insomnia.      Objective  Vitals:   06/28/18 0941  BP: 107/74  Pulse: 74  Resp: 16  SpO2: 97%  Weight: 126 lb (57.2 kg)  Height: 5' 1.25" (1.556 m)    Physical Exam  Constitutional: No distress.  HENT:  Head: Normocephalic and atraumatic.  Right Ear: Tympanic membrane and external ear normal.  Left Ear: Tympanic membrane and external ear normal.  Nose: Nose normal. No mucosal edema.  Mouth/Throat: Uvula is midline and oropharynx is clear and moist. No  oropharyngeal exudate, posterior oropharyngeal edema or posterior oropharyngeal erythema.  Eyes: Pupils are equal, round, and reactive to light. Conjunctivae and EOM are normal. Right eye exhibits no discharge. Left eye exhibits no discharge.  Neck: Normal range of motion. Neck supple. No JVD present. No thyromegaly present.  Cardiovascular: Normal rate, regular rhythm, S1 normal, S2 normal, normal heart sounds and intact distal pulses. Exam reveals no gallop, no S3, no S4, no distant heart sounds and no friction rub.  No murmur heard. Pulses:      Carotid pulses are 2+ on the right side, and 2+ on the left side.      Radial pulses are 2+ on the right side, and 2+ on the left side.       Femoral pulses are 2+ on the right side, and 2+ on the left side.      Popliteal pulses  are 2+ on the right side, and 2+ on the left side.       Dorsalis pedis pulses are 2+ on the right side, and 2+ on the left side.       Posterior tibial pulses are 2+ on the right side, and 2+ on the left side.  Pulmonary/Chest: Effort normal and breath sounds normal. She has no wheezes.  Abdominal: Soft. Bowel sounds are normal. She exhibits no mass. There is no tenderness. There is no guarding.  Musculoskeletal: Normal range of motion. She exhibits no edema.  Lymphadenopathy:    She has no cervical adenopathy.  Neurological: She is alert. She has normal reflexes.  Skin: Skin is warm and dry. She is not diaphoretic.  Nursing note and vitals reviewed.     Assessment & Plan  Problem List Items Addressed This Visit      Endocrine   Type 2 diabetes mellitus with diabetic polyneuropathy, with long-term current use of insulin (HCC) - Primary    Patient compliant with current regimen. Continue Treseba,metformen but will hold glimiperide until resultan A1C obtained,      Relevant Orders   Hemoglobin A1c      No orders of the defined types were placed in this encounter.     Dr. [facility]  06/28/18

## 2018-06-28 NOTE — Assessment & Plan Note (Signed)
Patient compliant with current regimen. Continue Treseba,metformen but will hold glimiperide until resultan A1C obtained,

## 2018-07-04 ENCOUNTER — Other Ambulatory Visit: Payer: Self-pay

## 2018-07-04 DIAGNOSIS — Z794 Long term (current) use of insulin: Principal | ICD-10-CM

## 2018-07-04 DIAGNOSIS — E1142 Type 2 diabetes mellitus with diabetic polyneuropathy: Secondary | ICD-10-CM

## 2018-09-12 ENCOUNTER — Telehealth: Payer: Self-pay

## 2018-09-12 NOTE — Telephone Encounter (Signed)
Pt called requesting BS lancets- she is supposed to be seeing Dr Verdis Frederickson for diabetes. We did fill her diab med so she could have time to get back in with Dr Gershon Crane in Aug. She was told  She needed to call them for this Rx. I did leave his telephone number on the machine.

## 2019-07-16 ENCOUNTER — Other Ambulatory Visit: Payer: Self-pay

## 2019-07-16 ENCOUNTER — Ambulatory Visit: Payer: PRIVATE HEALTH INSURANCE | Admitting: Nurse Practitioner

## 2019-07-16 ENCOUNTER — Encounter: Payer: Self-pay | Admitting: Nurse Practitioner

## 2019-07-16 VITALS — BP 96/61 | HR 95 | Resp 16 | Ht 61.0 in | Wt 116.0 lb

## 2019-07-16 DIAGNOSIS — E782 Mixed hyperlipidemia: Secondary | ICD-10-CM

## 2019-07-16 DIAGNOSIS — Z1239 Encounter for other screening for malignant neoplasm of breast: Secondary | ICD-10-CM

## 2019-07-16 DIAGNOSIS — K219 Gastro-esophageal reflux disease without esophagitis: Secondary | ICD-10-CM

## 2019-07-16 DIAGNOSIS — E1165 Type 2 diabetes mellitus with hyperglycemia: Secondary | ICD-10-CM

## 2019-07-16 NOTE — Progress Notes (Signed)
[facility] [address] [place], [place] [address]  Internal MEDICINE  Office Visit Note  Patient Name: Kristin Cruz  [id number]  [id number]  Date of Service: 07/18/2019   Complaints/HPI Pt is here for establishment of PCP. Chief Complaint  Patient presents with  . Diabetes    pt was on jardiance stopped taking 2 months ago because pt could not afford it  . Gastroesophageal Reflux  . Hyperlipidemia  . Medication Refill    omeprazole,vitamin d   The patient is here to establish primary care provider .she is transitioning her care from another local provider. She was unhappy with the care. Felt as though providers did not listen to her. She is diabetic. Her blood sugars are very elevated with HgbA1c 11.4 today. She is prescribed long acting insulin. States that she takes this according to a sliding scale. Generally does not take more than 20 units at a time and most days, does not take any at all. Prescription is for long acting insulin at 30 units everyday. She does take metformin 500mg  twice daily. She does not take her blood sugars. She experiences neuropathy in her feet intermittently. She states that she is due for screening mammogram and routine physical exam and pap smear. Her most recent las were done    Current Medication: Outpatient Encounter Medications as of 07/16/2019  Medication Sig  . aspirin 81 MG chewable tablet Chew by mouth.  Marland Kitchen FREESTYLE INSULINX TEST test strip USE WITH DEVICE TO CHECK SUGARS TWICE DAILY  . insulin degludec (TRESIBA FLEXTOUCH) 100 UNIT/ML SOPN FlexTouch Pen Inject 0.3 mLs (30 Units total) into the skin daily.  . metFORMIN (GLUCOPHAGE) 500 MG tablet Take 1 tablet (500 mg total) by mouth 2 (two) times daily with a meal.  . omeprazole (PRILOSEC) 40 MG capsule TAKE 1 TABLET ONCE A DAY 30 MINUTES BEFORE MEAL  . rosuvastatin (CRESTOR) 10 MG tablet Take 1 tablet (10 mg total) by mouth daily.  . Vitamin D, Ergocalciferol, (DRISDOL)  50000 units CAPS capsule TAKE ONE CAPSULE BY MOUTH WEEKLY FOR LOW VITAMIN D  . [DISCONTINUED] glimepiride (AMARYL) 4 MG tablet Take 1 tablet (4 mg total) by mouth 1 day or 1 dose.  . [DISCONTINUED] naproxen sodium (ANAPROX) 550 MG tablet    No facility-administered encounter medications on file as of 07/16/2019.     Surgical History: History reviewed. No pertinent surgical history.  Medical History: Past Medical History:  Diagnosis Date  . Diabetes mellitus without complication (HCC)   . GERD (gastroesophageal reflux disease)   . High cholesterol     Family History: Family History  Problem Relation Age of Onset  . Breast cancer Neg Hx     Social History   Socioeconomic History  . Marital status: Legally Separated    Spouse name: Not on file  . Number of children: Not on file  . Years of education: Not on file  . Highest education level: Not on file  Occupational History  . Not on file  Social Needs  . Financial resource strain: Not on file  . Food insecurity    Worry: Not on file    Inability: Not on file  . Transportation needs    Medical: Not on file    Non-medical: Not on file  Tobacco Use  . Smoking status: Never Smoker  . Smokeless tobacco: Current User    Types: Snuff  Substance and Sexual Activity  . Alcohol use: Not Currently  . Drug use: Never  .  Sexual activity: Not on file  Lifestyle  . Physical activity    Days per week: Not on file    Minutes per session: Not on file  . Stress: Not on file  Relationships  . Social Musicianconnections    Talks on phone: Not on file    Gets together: Not on file    Attends religious service: Not on file    Active member of club or organization: Not on file    Attends meetings of clubs or organizations: Not on file    Relationship status: Not on file  . Intimate partner violence    Fear of current or ex partner: Not on file    Emotionally abused: Not on file    Physically abused: Not on file    Forced sexual  activity: Not on file  Other Topics Concern  . Not on file  Social History Narrative  . Not on file     Review of Systems  Constitutional: Negative for activity change, chills, fatigue and unexpected weight change.  HENT: Negative for congestion, postnasal drip, rhinorrhea, sneezing and sore throat.   Respiratory: Negative for cough, chest tightness, shortness of breath and wheezing.   Cardiovascular: Negative for chest pain and palpitations.  Gastrointestinal: Negative for abdominal pain, constipation, diarrhea, nausea and vomiting.  Endocrine: Negative for cold intolerance, heat intolerance, polydipsia and polyuria.       Blood sugars are very elevated.   Musculoskeletal: Negative for arthralgias, back pain, joint swelling and neck pain.  Skin: Negative for rash.  Allergic/Immunologic: Negative for environmental allergies.  Neurological: Positive for numbness. Negative for tremors.       Numbness and tingling in both feet periodically.  Hematological: Negative for adenopathy. Does not bruise/bleed easily.  Psychiatric/Behavioral: Negative for behavioral problems (Depression), sleep disturbance and suicidal ideas. The patient is not nervous/anxious.     Today's Vitals   07/16/19 1132  BP: 96/61  Pulse: 95  Resp: 16  SpO2: 99%  Weight: 116 lb (52.6 kg)  Height: 5\' 1"  (1.549 m)   Body mass index is 21.92 kg/m.  Physical Exam Vitals signs and nursing note reviewed.  Constitutional:      General: She is not in acute distress.    Appearance: Normal appearance. She is well-developed. She is not diaphoretic.  HENT:     Head: Normocephalic and atraumatic.     Mouth/Throat:     Pharynx: No oropharyngeal exudate.  Eyes:     Pupils: Pupils are equal, round, and reactive to light.  Neck:     Musculoskeletal: Normal range of motion and neck supple.     Thyroid: No thyromegaly.     Vascular: No carotid bruit or JVD.     Trachea: No tracheal deviation.  Cardiovascular:      Rate and Rhythm: Normal rate and regular rhythm.     Heart sounds: Normal heart sounds. No murmur. No friction rub. No gallop.   Pulmonary:     Effort: Pulmonary effort is normal. No respiratory distress.     Breath sounds: Normal breath sounds. No wheezing or rales.  Chest:     Chest wall: No tenderness.  Abdominal:     General: Bowel sounds are normal.     Palpations: Abdomen is soft.     Tenderness: There is no abdominal tenderness.  Musculoskeletal: Normal range of motion.  Lymphadenopathy:     Cervical: No cervical adenopathy.  Skin:    General: Skin is warm and dry.  Neurological:  Mental Status: She is alert and oriented to person, place, and time.     Cranial Nerves: No cranial nerve deficit.  Psychiatric:        Behavior: Behavior normal.        Thought Content: Thought content normal.        Judgment: Judgment normal.    Assessment/Plan: 1. Type 2 diabetes mellitus with hyperglycemia, unspecified whether long term insulin use (HCC) - POCT HgB A1C 11.4 today. Will have patient continue metformin 500mg t wice daily. She should start antus every day at 10 units. Check sugars every morning. She should increase lantus by 2 units every few days until blood sugars are regularly running 150 or lower. She voiced understanding of these instructions.   2. Screening for breast cancer - MM DIGITAL SCREENING BILATERAL; Future  3. Mixed hyperlipidemia Reviewed labs from 09/2018 which indicates good control of cholesterol. Continue crestor as prescribed.   4. Gastroesophageal reflux disease without esophagitis Continue omeprazole as prescribed .  General Counseling: jacyln carmer understanding of the findings of todays visit and agrees with plan of treatment. I have discussed any further diagnostic evaluation that may be needed or ordered today. We also reviewed her medications today. she has been encouraged to call the office with any questions or concerns that should arise  related to todays visit.    Counseling:  Diabetes Counseling:  1. Addition of ACE inh/ ARB'S for nephroprotection. Microalbumin is updated  2. Diabetic foot care, prevention of complications. Podiatry consult 3. Exercise and lose weight.  4. Diabetic eye examination, Diabetic eye exam is updated  5. Monitor blood sugar closlely. nutrition counseling.  6. Sign and symptoms of hypoglycemia including shaking sweating,confusion and headaches.   This patient was seen by Leretha Pol FNP Collaboration with Dr Lavera Guise as a part of collaborative care agreement  Orders Placed This Encounter  Procedures  . MM DIGITAL SCREENING BILATERAL  . POCT HgB A1C     Time spent: 30 Minutes

## 2019-07-17 ENCOUNTER — Encounter: Payer: Self-pay | Admitting: Nurse Practitioner

## 2019-07-18 DIAGNOSIS — E1165 Type 2 diabetes mellitus with hyperglycemia: Secondary | ICD-10-CM | POA: Insufficient documentation

## 2019-07-18 DIAGNOSIS — Z1239 Encounter for other screening for malignant neoplasm of breast: Secondary | ICD-10-CM | POA: Insufficient documentation

## 2019-07-18 DIAGNOSIS — K219 Gastro-esophageal reflux disease without esophagitis: Secondary | ICD-10-CM | POA: Insufficient documentation

## 2019-07-18 DIAGNOSIS — E782 Mixed hyperlipidemia: Secondary | ICD-10-CM | POA: Insufficient documentation

## 2019-07-18 LAB — POCT GLYCOSYLATED HEMOGLOBIN (HGB A1C): Hemoglobin A1C: 11.4 % — AB (ref 4.0–5.6)

## 2019-07-24 ENCOUNTER — Other Ambulatory Visit: Payer: Self-pay

## 2019-07-24 MED ORDER — VITAMIN D (ERGOCALCIFEROL) 1.25 MG (50000 UNIT) PO CAPS: ORAL_CAPSULE | ORAL | 3 refills | Status: DC

## 2019-07-24 MED ORDER — OMEPRAZOLE 40 MG PO CPDR: DELAYED_RELEASE_CAPSULE | ORAL | 1 refills | Status: DC

## 2019-08-09 ENCOUNTER — Ambulatory Visit
Admission: RE | Admit: 2019-08-09 | Discharge: 2019-08-09 | Disposition: A | Discharge status: Home / Self Care | Payer: PRIVATE HEALTH INSURANCE | Source: Ambulatory Visit | Attending: Nurse Practitioner | Admitting: Nurse Practitioner

## 2019-08-09 ENCOUNTER — Other Ambulatory Visit: Payer: Self-pay

## 2019-08-09 DIAGNOSIS — Z1231 Encounter for screening mammogram for malignant neoplasm of breast: Secondary | ICD-10-CM | POA: Diagnosis not present

## 2019-08-09 DIAGNOSIS — Z1239 Encounter for other screening for malignant neoplasm of breast: Secondary | ICD-10-CM

## 2019-08-16 ENCOUNTER — Ambulatory Visit: Payer: PRIVATE HEALTH INSURANCE | Admitting: Nurse Practitioner

## 2019-08-16 ENCOUNTER — Encounter: Payer: Self-pay | Admitting: Nurse Practitioner

## 2019-08-16 ENCOUNTER — Other Ambulatory Visit: Payer: Self-pay

## 2019-08-16 VITALS — BP 100/70 | HR 90 | Temp 97.0°F | Resp 16 | Ht 61.0 in | Wt 120.0 lb

## 2019-08-16 DIAGNOSIS — E782 Mixed hyperlipidemia: Secondary | ICD-10-CM

## 2019-08-16 DIAGNOSIS — M25512 Pain in left shoulder: Secondary | ICD-10-CM

## 2019-08-16 DIAGNOSIS — E559 Vitamin D deficiency, unspecified: Secondary | ICD-10-CM

## 2019-08-16 DIAGNOSIS — E1165 Type 2 diabetes mellitus with hyperglycemia: Secondary | ICD-10-CM

## 2019-08-16 MED ORDER — VITAMIN D (ERGOCALCIFEROL) 1.25 MG (50000 UNIT) PO CAPS: ORAL_CAPSULE | ORAL | 3 refills | Status: DC

## 2019-08-16 NOTE — Progress Notes (Signed)
[facility] [address] [place], [place] [address]  Internal MEDICINE  Office Visit Note  Patient Name: Kristin Cruz  [id number]  [id number]  Date of Service: 08/26/2019  Chief Complaint  Patient presents with  . Diabetes    with just metformin 160, works at night   . Hyperlipidemia  . Gastroesophageal Reflux  . Arm Pain    left arm has been sore, believes because she lays on it, able to move regularly    The patient is here for routine follow up visit. She is doing well today. Blood sugars running in mid 100s while taking metformin. Currently not taking her insulin. She does have soreness of her left arm. This is mostly after she has been sleeping. States that she often sleeps on her left arm. As she gets moving, the pain get better. She has good ROM and strength of the left arm. She has had no injury or trauma to the left arm.       Current Medication: Outpatient Encounter Medications as of 08/16/2019  Medication Sig  . aspirin 81 MG chewable tablet Chew by mouth.  Marland Kitchen FREESTYLE INSULINX TEST test strip USE WITH DEVICE TO CHECK SUGARS TWICE DAILY  . insulin degludec (TRESIBA FLEXTOUCH) 100 UNIT/ML SOPN FlexTouch Pen Inject 0.3 mLs (30 Units total) into the skin daily.  . metFORMIN (GLUCOPHAGE) 500 MG tablet Take 1 tablet (500 mg total) by mouth 2 (two) times daily with a meal.  . omeprazole (PRILOSEC) 40 MG capsule TAKE 1 TABLET ONCE A DAY 30 MINUTES BEFORE MEAL  . rosuvastatin (CRESTOR) 10 MG tablet Take 1 tablet (10 mg total) by mouth daily.  . Vitamin D, Ergocalciferol, (DRISDOL) 1.25 MG (50000 UT) CAPS capsule TAKE ONE CAPSULE BY MOUTH WEEKLY FOR LOW VITAMIN D  . [DISCONTINUED] Vitamin D, Ergocalciferol, (DRISDOL) 1.25 MG (50000 UT) CAPS capsule TAKE ONE CAPSULE BY MOUTH WEEKLY FOR LOW VITAMIN D   No facility-administered encounter medications on file as of 08/16/2019.     Surgical History: History reviewed. No pertinent surgical history.  Medical  History: Past Medical History:  Diagnosis Date  . Diabetes mellitus without complication (HCC)   . GERD (gastroesophageal reflux disease)   . High cholesterol     Family History: Family History  Problem Relation Age of Onset  . Breast cancer Neg Hx     Social History   Socioeconomic History  . Marital status: Legally Separated    Spouse name: Not on file  . Number of children: Not on file  . Years of education: Not on file  . Highest education level: Not on file  Occupational History  . Not on file  Social Needs  . Financial resource strain: Not on file  . Food insecurity    Worry: Not on file    Inability: Not on file  . Transportation needs    Medical: Not on file    Non-medical: Not on file  Tobacco Use  . Smoking status: Never Smoker  . Smokeless tobacco: Current User    Types: Snuff  Substance and Sexual Activity  . Alcohol use: Not Currently  . Drug use: Never  . Sexual activity: Not on file  Lifestyle  . Physical activity    Days per week: Not on file    Minutes per session: Not on file  . Stress: Not on file  Relationships  . Social Musician on phone: Not on file    Gets together: Not on  file    Attends religious service: Not on file    Active member of club or organization: Not on file    Attends meetings of clubs or organizations: Not on file    Relationship status: Not on file  . Intimate partner violence    Fear of current or ex partner: Not on file    Emotionally abused: Not on file    Physically abused: Not on file    Forced sexual activity: Not on file  Other Topics Concern  . Not on file  Social History Narrative  . Not on file      Review of Systems  Constitutional: Negative for activity change, chills, fatigue and unexpected weight change.  HENT: Negative for congestion, postnasal drip, rhinorrhea, sneezing and sore throat.   Respiratory: Negative for cough, chest tightness, shortness of breath and wheezing.    Cardiovascular: Negative for chest pain and palpitations.  Gastrointestinal: Negative for abdominal pain, constipation, diarrhea, nausea and vomiting.  Endocrine: Negative for cold intolerance, heat intolerance, polydipsia and polyuria.       Blood sugars are improving since her last visit.    Musculoskeletal: Negative for arthralgias, back pain, joint swelling and neck pain.       Lefr arm soreness.  Skin: Negative for rash.  Allergic/Immunologic: Negative for environmental allergies.  Neurological: Positive for numbness. Negative for tremors.       Numbness and tingling in both feet periodically.  Hematological: Negative for adenopathy. Does not bruise/bleed easily.  Psychiatric/Behavioral: Negative for behavioral problems (Depression), sleep disturbance and suicidal ideas. The patient is not nervous/anxious.     Today's Vitals   08/16/19 1109  BP: 100/70  Pulse: 90  Resp: 16  Temp: (!) 97 F (36.1 C)  SpO2: 99%  Weight: 120 lb (54.4 kg)  Height: 5\' 1"  (1.549 m)   Body mass index is 22.67 kg/m.  Physical Exam Vitals signs and nursing note reviewed.  Constitutional:      General: She is not in acute distress.    Appearance: Normal appearance. She is well-developed. She is not diaphoretic.  HENT:     Head: Normocephalic and atraumatic.     Mouth/Throat:     Pharynx: No oropharyngeal exudate.  Eyes:     Pupils: Pupils are equal, round, and reactive to light.  Neck:     Musculoskeletal: Normal range of motion and neck supple.     Thyroid: No thyromegaly.     Vascular: No carotid bruit or JVD.     Trachea: No tracheal deviation.  Cardiovascular:     Rate and Rhythm: Normal rate and regular rhythm.     Heart sounds: Normal heart sounds. No murmur. No friction rub. No gallop.   Pulmonary:     Effort: Pulmonary effort is normal. No respiratory distress.     Breath sounds: Normal breath sounds. No wheezing or rales.  Chest:     Chest wall: No tenderness.  Abdominal:      General: Bowel sounds are normal.     Palpations: Abdomen is soft.     Tenderness: There is no abdominal tenderness.  Musculoskeletal: Normal range of motion.     Comments: Left shoulder has no palpable abnormalities or deformities. ROM and strength are both intact.   Lymphadenopathy:     Cervical: No cervical adenopathy.  Skin:    General: Skin is warm and dry.  Neurological:     Mental Status: She is alert and oriented to person, place, and time.  Cranial Nerves: No cranial nerve deficit.  Psychiatric:        Behavior: Behavior normal.        Thought Content: Thought content normal.        Judgment: Judgment normal.   Assessment/Plan: 1. Type 2 diabetes mellitus with hyperglycemia, unspecified whether long term insulin use (HCC) Improved blood sugars. Continue diabetic medication as prescribed. Will check HgbA1c at next visit.   2. Pain of left shoulder region Good ROM and strength of left shoulder. Will monitor.   3. Mixed hyperlipidemia Stable.   4. Vitamin D deficiency - Vitamin D, Ergocalciferol, (DRISDOL) 1.25 MG (50000 UT) CAPS capsule; TAKE ONE CAPSULE BY MOUTH WEEKLY FOR LOW VITAMIN D  Dispense: 4 capsule; Refill: 3  General Counseling: Earsie verbalizes understanding of the findings of todays visit and agrees with plan of treatment. I have discussed any further diagnostic evaluation that may be needed or ordered today. We also reviewed her medications today. she has been encouraged to call the office with any questions or concerns that should arise related to todays visit.  Diabetes Counseling:  1. Addition of ACE inh/ ARB'S for nephroprotection. Microalbumin is updated  2. Diabetic foot care, prevention of complications. Podiatry consult 3. Exercise and lose weight.  4. Diabetic eye examination, Diabetic eye exam is updated  5. Monitor blood sugar closlely. nutrition counseling.  6. Sign and symptoms of hypoglycemia including shaking sweating,confusion and  headaches.  This patient was seen by Vincent GrosHeather Sheli Dorin FNP Collaboration with Dr Lyndon CodeFozia M Khan as a part of collaborative care agreement  Meds ordered this encounter  Medications  . Vitamin D, Ergocalciferol, (DRISDOL) 1.25 MG (50000 UT) CAPS capsule    Sig: TAKE ONE CAPSULE BY MOUTH WEEKLY FOR LOW VITAMIN D    Dispense:  4 capsule    Refill:  3    Order Specific Question:   Supervising Provider    Answer:   Lyndon CodeKHAN, FOZIA M [[id number]]    Time spent:25 Minutes      Dr Lyndon CodeFozia M Khan Internal medicine

## 2019-08-26 DIAGNOSIS — M25512 Pain in left shoulder: Secondary | ICD-10-CM | POA: Insufficient documentation

## 2019-10-09 ENCOUNTER — Telehealth: Payer: Self-pay

## 2019-10-09 NOTE — Telephone Encounter (Signed)
CONFIRMED AND SCREENED FOR 10-15-19 OV. °

## 2019-10-15 ENCOUNTER — Telehealth: Payer: Self-pay

## 2019-10-15 ENCOUNTER — Other Ambulatory Visit: Payer: No Typology Code available for payment source | Admitting: Nurse Practitioner

## 2019-10-15 NOTE — Telephone Encounter (Signed)
Patient rescheduled appointment on 10/15/2019 to 11/20/2019, patients daughter tested positive for covid. [facility]

## 2019-11-07 ENCOUNTER — Other Ambulatory Visit: Payer: Self-pay | Admitting: Nurse Practitioner

## 2019-11-07 ENCOUNTER — Telehealth: Payer: Self-pay | Admitting: Nurse Practitioner

## 2019-11-07 DIAGNOSIS — B3731 Acute candidiasis of vulva and vagina: Secondary | ICD-10-CM

## 2019-11-07 DIAGNOSIS — B373 Candidiasis of vulva and vagina: Secondary | ICD-10-CM

## 2019-11-07 MED ORDER — FLUCONAZOLE 150 MG PO TABS: ORAL_TABLET | ORAL | 0 refills | Status: DC

## 2019-11-07 NOTE — Telephone Encounter (Signed)
Patient c/o yeast infection. Add diflucan. Take once. May repeat dose in three days if symptoms are persistent. Sent to [facility] in [place].  

## 2019-11-07 NOTE — Progress Notes (Signed)
Patient c/o yeast infection. Add diflucan. Take once. May repeat dose in three days if symptoms are persistent. Sent to [facility] in [place].

## 2019-11-15 ENCOUNTER — Telehealth: Payer: Self-pay

## 2019-11-15 NOTE — Telephone Encounter (Signed)
CONFIRMED AND SCREENED FOR 11-20-19 OV. 

## 2019-11-20 ENCOUNTER — Other Ambulatory Visit: Payer: Self-pay

## 2019-11-20 ENCOUNTER — Ambulatory Visit (INDEPENDENT_AMBULATORY_CARE_PROVIDER_SITE_OTHER): Payer: PRIVATE HEALTH INSURANCE | Admitting: Nurse Practitioner

## 2019-11-20 ENCOUNTER — Encounter (INDEPENDENT_AMBULATORY_CARE_PROVIDER_SITE_OTHER): Payer: Self-pay

## 2019-11-20 ENCOUNTER — Encounter: Payer: Self-pay | Admitting: Nurse Practitioner

## 2019-11-20 VITALS — BP 118/74 | HR 89 | Temp 97.4°F | Resp 16 | Ht 61.0 in | Wt 121.6 lb

## 2019-11-20 DIAGNOSIS — Z124 Encounter for screening for malignant neoplasm of cervix: Secondary | ICD-10-CM | POA: Diagnosis not present

## 2019-11-20 DIAGNOSIS — E1142 Type 2 diabetes mellitus with diabetic polyneuropathy: Secondary | ICD-10-CM

## 2019-11-20 DIAGNOSIS — Z0001 Encounter for general adult medical examination with abnormal findings: Secondary | ICD-10-CM

## 2019-11-20 DIAGNOSIS — Z794 Long term (current) use of insulin: Secondary | ICD-10-CM

## 2019-11-20 DIAGNOSIS — E1165 Type 2 diabetes mellitus with hyperglycemia: Secondary | ICD-10-CM

## 2019-11-20 DIAGNOSIS — E559 Vitamin D deficiency, unspecified: Secondary | ICD-10-CM | POA: Diagnosis not present

## 2019-11-20 DIAGNOSIS — R300 Dysuria: Secondary | ICD-10-CM

## 2019-11-20 DIAGNOSIS — E782 Mixed hyperlipidemia: Secondary | ICD-10-CM

## 2019-11-20 LAB — POCT GLYCOSYLATED HEMOGLOBIN (HGB A1C): Hemoglobin A1C: 10.7 % — AB (ref 4.0–5.6)

## 2019-11-20 MED ORDER — VITAMIN D (ERGOCALCIFEROL) 1.25 MG (50000 UNIT) PO CAPS: ORAL_CAPSULE | ORAL | 3 refills | Status: DC

## 2019-11-20 MED ORDER — TRESIBA FLEXTOUCH 100 UNIT/ML ~~LOC~~ SOPN: 20.0000 [IU] | PEN_INJECTOR | Freq: Every day | SUBCUTANEOUS | 3 refills | Status: DC

## 2019-11-20 MED ORDER — ROSUVASTATIN CALCIUM 10 MG PO TABS: 10.0000 mg | ORAL_TABLET | Freq: Every day | ORAL | 3 refills | Status: DC

## 2019-11-20 MED ORDER — METFORMIN HCL 500 MG PO TABS: 500.0000 mg | ORAL_TABLET | Freq: Two times a day (BID) | ORAL | 3 refills | Status: DC

## 2019-11-20 NOTE — Progress Notes (Signed)
[facility] [address] [place], [place] [address]  Internal MEDICINE  Office Visit Note  Patient Name: Kristin Cruz  [id number]  [id number]  Date of Service: 11/24/2019   Pt is here for routine health maintenance examination  Chief Complaint  Patient presents with  . Annual Exam  . Gynecologic Exam  . Diabetes  . Gastroesophageal Reflux  . Hyperlipidemia  . Arm Pain    hard to lift arm, has been going on for a while   . Quality Metric Gaps    diabetic foot and eye exam, colonoscopy, pna vacc     The patient is here for health maintenance exam with pap smear. The patient is diabetic. Her blood sugars are still running very elevated. It has improved since she was last seen. Her HgbA1c is 10.7, down from 11.4 at her last check. She is using her lantus 10units daily, most days. There are some days she will use more, especially when her blood sugars are running more elevated. States that she only checks her blood sugars every few days. She states that she feels well. She is tired, she just got finished working. 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     Current Medication: Outpatient Encounter Medications as of 11/20/2019  Medication Sig  . aspirin 81 MG chewable tablet Chew by mouth.  Marland Kitchen FREESTYLE INSULINX TEST test strip USE WITH DEVICE TO CHECK SUGARS TWICE DAILY  . insulin degludec (TRESIBA FLEXTOUCH) 100 UNIT/ML SOPN FlexTouch Pen Inject 0.2 mLs (20 Units total) into the skin daily.  . metFORMIN (GLUCOPHAGE) 500 MG tablet Take 1 tablet (500 mg total) by mouth 2 (two) times daily with a meal.  . omeprazole (PRILOSEC) 40 MG capsule TAKE 1 TABLET ONCE A DAY 30 MINUTES BEFORE MEAL  . rosuvastatin (CRESTOR) 10 MG tablet Take 1 tablet (10 mg total) by mouth daily.  . Vitamin D, Ergocalciferol, (DRISDOL) 1.25 MG (50000 UT) CAPS capsule TAKE ONE CAPSULE BY MOUTH WEEKLY FOR LOW VITAMIN D  . [DISCONTINUED] insulin degludec (TRESIBA FLEXTOUCH)  100 UNIT/ML SOPN FlexTouch Pen Inject 0.3 mLs (30 Units total) into the skin daily.  . [DISCONTINUED] metFORMIN (GLUCOPHAGE) 500 MG tablet Take 1 tablet (500 mg total) by mouth 2 (two) times daily with a meal.  . [DISCONTINUED] rosuvastatin (CRESTOR) 10 MG tablet Take 1 tablet (10 mg total) by mouth daily.  . [DISCONTINUED] Vitamin D, Ergocalciferol, (DRISDOL) 1.25 MG (50000 UT) CAPS capsule TAKE ONE CAPSULE BY MOUTH WEEKLY FOR LOW VITAMIN D  . [DISCONTINUED] fluconazole (DIFLUCAN) 150 MG tablet Take 1 tablet po once. May repeat dose in 3 days as needed for persistent symptoms. (Patient not taking: Reported on 11/20/2019)   No facility-administered encounter medications on file as of 11/20/2019.    Surgical History: History reviewed. No pertinent surgical history.  Medical History: Past Medical History:  Diagnosis Date  . Diabetes mellitus without complication (HCC)   . GERD (gastroesophageal reflux disease)   . High cholesterol     Family History: Family History  Problem Relation Age of Onset  . Breast cancer Neg Hx       Review of Systems  Constitutional: Positive for fatigue. Negative for activity change, chills and unexpected weight change.  HENT: Negative for congestion, postnasal drip, rhinorrhea, sneezing and sore throat.   Respiratory: Negative for cough, chest tightness, shortness of breath and wheezing.   Cardiovascular: Negative for chest pain and palpitations.  Gastrointestinal: Negative for abdominal pain, constipation,  diarrhea, nausea and vomiting.  Endocrine: Negative for cold intolerance, heat intolerance, polydipsia and polyuria.       Blood sugars are improving since her last visit, however, they are still very elevated   Genitourinary: Negative for dysuria, frequency and urgency.  Musculoskeletal: Negative for arthralgias, back pain, joint swelling and neck pain.  Skin: Negative for rash.  Allergic/Immunologic: Negative for environmental allergies.   Neurological: Negative for tremors and numbness.  Hematological: Negative for adenopathy. Does not bruise/bleed easily.  Psychiatric/Behavioral: Negative for behavioral problems (Depression), sleep disturbance and suicidal ideas. The patient is not nervous/anxious.      Today's Vitals   11/20/19 0931  BP: 118/74  Pulse: 89  Resp: 16  Temp: (!) 97.4 F (36.3 C)  SpO2: 99%  Weight: 121 lb 9.6 oz (55.2 kg)  Height: 5\' 1"  (1.549 m)   Body mass index is 22.98 kg/m.  Physical Exam Vitals and nursing note reviewed.  Constitutional:      General: She is not in acute distress.    Appearance: Normal appearance. She is well-developed. She is not diaphoretic.  HENT:     Head: Normocephalic and atraumatic.     Nose: Nose normal.     Mouth/Throat:     Pharynx: No oropharyngeal exudate.  Eyes:     Extraocular Movements: Extraocular movements intact.     Pupils: Pupils are equal, round, and reactive to light.  Neck:     Thyroid: No thyromegaly.     Vascular: No carotid bruit or JVD.     Trachea: No tracheal deviation.  Cardiovascular:     Rate and Rhythm: Normal rate and regular rhythm.     Pulses:          Dorsalis pedis pulses are 1+ on the right side and 1+ on the left side.       Posterior tibial pulses are 1+ on the right side and 1+ on the left side.     Heart sounds: Normal heart sounds. No murmur. No friction rub. No gallop.   Pulmonary:     Effort: Pulmonary effort is normal. No respiratory distress.     Breath sounds: Normal breath sounds. No wheezing or rales.  Chest:     Chest wall: No tenderness.     Breasts:        Right: Normal. No swelling, bleeding, inverted nipple, mass, nipple discharge, skin change or tenderness.        Left: Normal. No swelling, bleeding, inverted nipple, mass, nipple discharge, skin change or tenderness.  Abdominal:     General: Bowel sounds are normal.     Palpations: Abdomen is soft.     Tenderness: There is no abdominal tenderness.      Hernia: There is no hernia in the left inguinal area or right inguinal area.  Genitourinary:    General: Normal vulva.     Exam position: Supine.     Labia:        Right: No tenderness, lesion or injury.        Left: No tenderness, lesion or injury.      Vagina: Normal. No vaginal discharge, erythema, tenderness or bleeding.     Cervix: No discharge, friability, lesion or erythema.     Uterus: Normal.      Adnexa: Right adnexa normal and left adnexa normal.       Right: No mass or tenderness.         Left: No mass or tenderness.  Comments: No tenderness, masses, or organomeglay present during bimanual exam . Musculoskeletal:        General: Normal range of motion.     Cervical back: Normal range of motion and neck supple.     Right foot: Normal range of motion. No deformity.     Left foot: Normal range of motion. No deformity.  Feet:     Right foot:     Protective Sensation: 10 sites tested. 10 sites sensed.     Skin integrity: Skin integrity normal. No blister, skin breakdown, erythema or warmth.     Left foot:     Protective Sensation: 10 sites tested. 10 sites sensed.     Skin integrity: Skin integrity normal. No blister, skin breakdown, erythema or warmth.  Lymphadenopathy:     Cervical: No cervical adenopathy.     Upper Body:     Right upper body: No axillary adenopathy.     Left upper body: No axillary adenopathy.     Lower Body: No right inguinal adenopathy.  Skin:    General: Skin is warm and dry.  Neurological:     General: No focal deficit present.     Mental Status: She is alert and oriented to person, place, and time.     Cranial Nerves: No cranial nerve deficit.  Psychiatric:        Mood and Affect: Mood normal.        Behavior: Behavior normal.        Thought Content: Thought content normal.        Judgment: Judgment normal.      LABS: Recent Results (from the past 2160 hour(s))  IGP, Aptima HPV     Status: None   Collection Time: 11/20/19 12:00  AM  Result Value Ref Range   Interpretation NILM     Comment: NEGATIVE FOR INTRAEPITHELIAL LESION OR MALIGNANCY.   Category NIL     Comment: Negative for Intraepithelial Lesion   Adequacy ENDO     Comment: Satisfactory for evaluation. Endocervical and/or squamous metaplastic cells (endocervical component) are present.    Clinician Provided ICD10 Comment     Comment: Z12.4   Performed by: Comment     Comment: Mauricia Area, Cytotechnologist (ASCP)   Note: Comment     Comment: The Pap smear is a screening test designed to aid in the detection of premalignant and malignant conditions of the uterine cervix.  It is not a diagnostic procedure and should not be used as the sole means of detecting cervical cancer.  Both false-positive and false-negative reports do occur.    Test Methodology Comment     Comment: This liquid based ThinPrep(R) pap test was screened with the use of an image guided system.    HPV Aptima Negative Negative    Comment: This nucleic acid amplification test detects fourteen high-risk HPV types (16,18,31,33,35,39,45,51,52,56,58,59,66,68) without differentiation.   UA/M w/rflx Culture, Routine     Status: Abnormal   Collection Time: 11/20/19 12:00 AM   Specimen: GYN   GYN  Result Value Ref Range   Specific Gravity, UA 1.023 1.005 - 1.030   pH, UA 5.0 5.0 - 7.5   Color, UA Yellow Yellow   Appearance Ur Clear Clear   Leukocytes,UA Negative Negative   Protein,UA Negative Negative/Trace   Glucose, UA 3+ (A) Negative   Ketones, UA Negative Negative   RBC, UA Negative Negative   Bilirubin, UA Negative Negative   Urobilinogen, Ur 0.2 0.2 - 1.0 mg/dL   Nitrite,  UA Negative Negative   Microscopic Examination Comment     Comment: Microscopic follows if indicated.   Microscopic Examination See below:     Comment: Microscopic was indicated and was performed.   Urinalysis Reflex Comment     Comment: This specimen will not reflex to a Urine Culture.  Microalbumin,  urine     Status: None   Collection Time: 11/20/19 12:00 AM  Result Value Ref Range   Microalbumin, Urine 4.1 Not Estab. ug/mL  Microscopic Examination     Status: None   Collection Time: 11/20/19 12:00 AM   GYN  Result Value Ref Range   WBC, UA 0-5 0 - 5 /hpf   RBC 0-2 0 - 2 /hpf   Epithelial Cells (non renal) None seen 0 - 10 /hpf   Casts None seen None seen /lpf   Bacteria, UA Few None seen/Few  POCT HgB A1C     Status: Abnormal   Collection Time: 11/20/19  9:57 AM  Result Value Ref Range   Hemoglobin A1C 10.7 (A) 4.0 - 5.6 %   HbA1c POC (<> result, manual entry)     HbA1c, POC (prediabetic range)     HbA1c, POC (controlled diabetic range)      Assessment/Plan:  1. Encounter for general adult medical examination with abnormal findings Annual health maintenance exam with pap smear today.   2. Type 2 diabetes mellitus with diabetic polyneuropathy, with long-term current use of insulin (HCC) Mildly improved. HgbA1c 10.7, down from 11.4 today. Increase daily dose of Tresiba to 20units every day. Continue metformin as prescribed. Monitor blood sugars closely. Consider adding sliding scale insulin if no significant improvement by next visit.  - insulin degludec (TRESIBA FLEXTOUCH) 100 UNIT/ML SOPN FlexTouch Pen; Inject 0.2 mLs (20 Units total) into the skin daily.  Dispense: 3 pen; Refill: 3 - metFORMIN (GLUCOPHAGE) 500 MG tablet; Take 1 tablet (500 mg total) by mouth 2 (two) times daily with a meal.  Dispense: 60 tablet; Refill: 3  3. Mixed hyperlipidemia Continue crestor 10mg  daily.  - rosuvastatin (CRESTOR) 10 MG tablet; Take 1 tablet (10 mg total) by mouth daily.  Dispense: 30 tablet; Refill: 3  4. Vitamin D deficiency Drisdol iu weekly.  - Vitamin D, Ergocalciferol, (DRISDOL) 1.25 MG (50000 UT) CAPS capsule; TAKE ONE CAPSULE BY MOUTH WEEKLY FOR LOW VITAMIN D  Dispense: 4 capsule; Refill: 3  5. Routine cervical smear - IGP, Aptima HPV  6. Dysuria - UA/M w/rflx  Culture, Routine  7. Type 2 diabetes mellitus with hyperglycemia, unspecified whether long term insulin use (HCC) - POCT HgB A1C - Microalbumin, urine   General Counseling: Shaqueena verbalizes understanding of the findings of todays visit and agrees with plan of treatment. I have discussed any further diagnostic evaluation that may be needed or ordered today. We also reviewed her medications today. she has been encouraged to call the office with any questions or concerns that should arise related to todays visit.    Counseling:  Diabetes Counseling:  1. Addition of ACE inh/ ARB'S for nephroprotection. Microalbumin is updated  2. Diabetic foot care, prevention of complications. Podiatry consult 3. Exercise and lose weight.  4. Diabetic eye examination, Diabetic eye exam is updated  5. Monitor blood sugar closlely. nutrition counseling.  6. Sign and symptoms of hypoglycemia including shaking sweating,confusion and headaches.  This patient was seen by [id number] FNP Collaboration with Dr Vincent Gros as a part of collaborative care agreement  Orders Placed This Encounter  Procedures  . Microscopic Examination  . UA/M w/rflx Culture, Routine  . Microalbumin, urine  . POCT HgB A1C    Meds ordered this encounter  Medications  . insulin degludec (TRESIBA FLEXTOUCH) 100 UNIT/ML SOPN FlexTouch Pen    Sig: Inject 0.2 mLs (20 Units total) into the skin daily.    Dispense:  3 pen    Refill:  3    Order Specific Question:   Supervising Provider    Answer:   Lyndon CodeKHAN, FOZIA M [[id number]]  . metFORMIN (GLUCOPHAGE) 500 MG tablet    Sig: Take 1 tablet (500 mg total) by mouth 2 (two) times daily with a meal.    Dispense:  60 tablet    Refill:  3    Order Specific Question:   Supervising Provider    Answer:   Lyndon CodeKHAN, FOZIA M [[id number]]  . rosuvastatin (CRESTOR) 10 MG tablet    Sig: Take 1 tablet (10 mg total) by mouth daily.    Dispense:  30 tablet    Refill:  3    Order Specific Question:    Supervising Provider    Answer:   Lyndon CodeKHAN, FOZIA M [[id number]]  . Vitamin D, Ergocalciferol, (DRISDOL) 1.25 MG (50000 UT) CAPS capsule    Sig: TAKE ONE CAPSULE BY MOUTH WEEKLY FOR LOW VITAMIN D    Dispense:  4 capsule    Refill:  3    Order Specific Question:   Supervising Provider    Answer:   Lyndon CodeKHAN, FOZIA M [[id number]]    Time spent: 1445 Minutes      Lyndon CodeFozia M Khan, MD  Internal Medicine

## 2019-11-21 LAB — UA/M W/RFLX CULTURE, ROUTINE
Bilirubin, UA: NEGATIVE
Ketones, UA: NEGATIVE
Leukocytes,UA: NEGATIVE
Nitrite, UA: NEGATIVE
Protein,UA: NEGATIVE
RBC, UA: NEGATIVE
Specific Gravity, UA: 1.023 (ref 1.005–1.030)
Urobilinogen, Ur: 0.2 mg/dL (ref 0.2–1.0)
pH, UA: 5 (ref 5.0–7.5)

## 2019-11-21 LAB — MICROSCOPIC EXAMINATION
Casts: NONE SEEN /lpf
Epithelial Cells (non renal): NONE SEEN /hpf (ref 0–10)

## 2019-11-21 LAB — MICROALBUMIN, URINE: Microalbumin, Urine: 4.1 ug/mL

## 2019-11-22 ENCOUNTER — Telehealth: Payer: Self-pay

## 2019-11-22 LAB — IGP, APTIMA HPV: HPV Aptima: NEGATIVE

## 2019-11-22 NOTE — Progress Notes (Signed)
Please let the patient know that her pap was normal. Thanks

## 2019-11-22 NOTE — Telephone Encounter (Signed)
Pt was notified that pap smear is normal

## 2019-11-22 NOTE — Telephone Encounter (Signed)
-----   Message from Carlean Jews, NP sent at 11/22/2019  3:03 PM EST ----- Please let the patient know that her pap was normal. Thanks.

## 2019-11-22 NOTE — Progress Notes (Signed)
Lmom to call us back 

## 2019-11-24 DIAGNOSIS — Z0001 Encounter for general adult medical examination with abnormal findings: Secondary | ICD-10-CM | POA: Insufficient documentation

## 2019-11-24 DIAGNOSIS — Z124 Encounter for screening for malignant neoplasm of cervix: Secondary | ICD-10-CM | POA: Insufficient documentation

## 2019-11-24 DIAGNOSIS — R300 Dysuria: Secondary | ICD-10-CM | POA: Insufficient documentation

## 2019-11-28 ENCOUNTER — Other Ambulatory Visit: Payer: Self-pay | Admitting: Nurse Practitioner

## 2019-11-28 DIAGNOSIS — Z794 Long term (current) use of insulin: Secondary | ICD-10-CM

## 2019-11-28 DIAGNOSIS — E1142 Type 2 diabetes mellitus with diabetic polyneuropathy: Secondary | ICD-10-CM

## 2019-11-28 MED ORDER — PEN NEEDLES 32G X 4 MM MISC: 1.0000 | Freq: Every day | 5 refills | Status: DC

## 2019-11-28 NOTE — Progress Notes (Signed)
Sent prescription for insulin pen needles to use with insulin dosing.

## 2020-01-07 ENCOUNTER — Encounter: Payer: Self-pay | Admitting: Adult Health

## 2020-01-07 ENCOUNTER — Ambulatory Visit: Payer: PRIVATE HEALTH INSURANCE | Admitting: Adult Health

## 2020-01-07 VITALS — Ht 61.0 in | Wt 122.0 lb

## 2020-01-07 DIAGNOSIS — R110 Nausea: Secondary | ICD-10-CM

## 2020-01-07 DIAGNOSIS — R079 Chest pain, unspecified: Secondary | ICD-10-CM

## 2020-01-07 DIAGNOSIS — R61 Generalized hyperhidrosis: Secondary | ICD-10-CM | POA: Diagnosis not present

## 2020-01-07 NOTE — Progress Notes (Signed)
[facility] [address] [place], [place] [address]  Internal MEDICINE  Telephone Visit  Patient Name: Kristin Cruz  [id number]  [id number]  Date of Service: 01/07/2020  I connected with the patient at 235 by telephone and verified the patients identity using two identifiers.   I discussed the limitations, risks, security and privacy concerns of performing an evaluation and management service by telephone and the availability of in person appointments. I also discussed with the patient that there may be a patient responsible charge related to the service.  The patient expressed understanding and agrees to proceed.    Chief Complaint  Patient presents with  . Telephone Assessment    Pt woke in the morning and sweating and throwing up and having chest pain and now no more chest pain but left arm is sore and nausea gone   . Telephone Screen    pt said she had same symptoms last week   . Nausea  . Chest Pain    HPI  Pt seen via telephone visit today.  She has a history of DM, and GERD.  Woke up this morning, Sweating, having chest pain, felt nauseated, but denies vomiting.  She reports she had left arm pain as well. She reports these symptoms lasted about one hour, her arm remains sore at this time.  She denies any dizziness or light headedness today.  She does report a similar episode one week ago but only had left arm numbness. This episode today lasted much longer, and has added symptoms.     Current Medication: Outpatient Encounter Medications as of 01/07/2020  Medication Sig  . aspirin 81 MG chewable tablet Chew by mouth.  Marland Kitchen FREESTYLE INSULINX TEST test strip USE WITH DEVICE TO CHECK SUGARS TWICE DAILY  . insulin degludec (TRESIBA FLEXTOUCH) 100 UNIT/ML SOPN FlexTouch Pen Inject 0.2 mLs (20 Units total) into the skin daily.  . Insulin Pen Needle (PEN NEEDLES) 32G X 4 MM MISC 1 each by Does not apply route daily. To use with insulin dosing up to three times daily  as needed.  E11.9  . metFORMIN (GLUCOPHAGE) 500 MG tablet Take 1 tablet (500 mg total) by mouth 2 (two) times daily with a meal.  . omeprazole (PRILOSEC) 40 MG capsule TAKE 1 TABLET ONCE A DAY 30 MINUTES BEFORE MEAL  . rosuvastatin (CRESTOR) 10 MG tablet Take 1 tablet (10 mg total) by mouth daily.  . Vitamin D, Ergocalciferol, (DRISDOL) 1.25 MG (50000 UT) CAPS capsule TAKE ONE CAPSULE BY MOUTH WEEKLY FOR LOW VITAMIN D   No facility-administered encounter medications on file as of 01/07/2020.    Surgical History: History reviewed. No pertinent surgical history.  Medical History: Past Medical History:  Diagnosis Date  . Diabetes mellitus without complication (HCC)   . GERD (gastroesophageal reflux disease)   . High cholesterol     Family History: Family History  Problem Relation Age of Onset  . Breast cancer Neg Hx     Social History   Socioeconomic History  . Marital status: Legally Separated    Spouse name: Not on file  . Number of children: Not on file  . Years of education: Not on file  . Highest education level: Not on file  Occupational History  . Not on file  Tobacco Use  . Smoking status: Never Smoker  . Smokeless tobacco: Current User    Types: Snuff  Substance and Sexual Activity  . Alcohol use: Not Currently  . Drug use: Never  .  Sexual activity: Not on file  Other Topics Concern  . Not on file  Social History Narrative  . Not on file   Social Determinants of Health   Financial Resource Strain:   . Difficulty of Paying Living Expenses: Not on file  Food Insecurity:   . Worried About Charity fundraiser in the Last Year: Not on file  . Ran Out of Food in the Last Year: Not on file  Transportation Needs:   . Lack of Transportation (Medical): Not on file  . Lack of Transportation (Non-Medical): Not on file  Physical Activity:   . Days of Exercise per Week: Not on file  . Minutes of Exercise per Session: Not on file  Stress:   . Feeling of Stress :  Not on file  Social Connections:   . Frequency of Communication with Friends and Family: Not on file  . Frequency of Social Gatherings with Friends and Family: Not on file  . Attends Religious Services: Not on file  . Active Member of Clubs or Organizations: Not on file  . Attends Archivist Meetings: Not on file  . Marital Status: Not on file  Intimate Partner Violence:   . Fear of Current or Ex-Partner: Not on file  . Emotionally Abused: Not on file  . Physically Abused: Not on file  . Sexually Abused: Not on file      Review of Systems  Constitutional: Negative for chills, fatigue and unexpected weight change.  HENT: Negative for congestion, rhinorrhea, sneezing and sore throat.   Eyes: Negative for photophobia, pain and redness.  Respiratory: Negative for cough, chest tightness and shortness of breath.   Cardiovascular: Negative for chest pain and palpitations.  Gastrointestinal: Negative for abdominal pain, constipation, diarrhea, nausea and vomiting.  Endocrine: Negative.   Genitourinary: Negative for dysuria and frequency.  Musculoskeletal: Negative for arthralgias, back pain, joint swelling and neck pain.  Skin: Negative for rash.  Allergic/Immunologic: Negative.   Neurological: Negative for tremors and numbness.  Hematological: Negative for adenopathy. Does not bruise/bleed easily.  Psychiatric/Behavioral: Negative for behavioral problems and sleep disturbance. The patient is not nervous/anxious.     Vital Signs: Ht 5\' 1"  (1.549 m)   Wt 122 lb (55.3 kg)   BMI 23.05 kg/m    Observation/Objective:  Well sounding over the phone. NAD noted   Assessment/Plan: 1. Chest pain, unspecified type Given that patient has similar episode last week there is concern for MI, angina, or TIA.  She should be evaluated as soon as possible.  Encouraged patient to go to Emergency Department. She is agreeable to this, and will go now.    2. Nausea Resolved in one  hour.  3. Diaphoresis Resolved after one hour.   General Counseling: jessah danser understanding of the findings of today's phone visit and agrees with plan of treatment. I have discussed any further diagnostic evaluation that may be needed or ordered today. We also reviewed her medications today. she has been encouraged to call the office with any questions or concerns that should arise related to todays visit.    No orders of the defined types were placed in this encounter.   No orders of the defined types were placed in this encounter.   Time spent: [facility] Internal medicine

## 2020-01-18 ENCOUNTER — Other Ambulatory Visit: Payer: Self-pay

## 2020-01-18 MED ORDER — FREESTYLE INSULINX TEST VI STRP: ORAL_STRIP | 3 refills | Status: DC

## 2020-01-18 MED ORDER — ACCU-CHEK FASTCLIX LANCETS MISC: 3 refills | Status: DC

## 2020-01-24 ENCOUNTER — Other Ambulatory Visit: Payer: Self-pay

## 2020-01-24 MED ORDER — ACCU-CHEK GUIDE VI STRP: ORAL_STRIP | 1 refills | Status: DC

## 2020-02-13 ENCOUNTER — Telehealth: Payer: Self-pay

## 2020-02-13 NOTE — Telephone Encounter (Signed)
lmom for patient to confirm and screen for 02-18-20 ov.

## 2020-02-18 ENCOUNTER — Encounter: Payer: Self-pay | Admitting: Nurse Practitioner

## 2020-02-18 ENCOUNTER — Other Ambulatory Visit: Payer: Self-pay

## 2020-02-18 ENCOUNTER — Ambulatory Visit (INDEPENDENT_AMBULATORY_CARE_PROVIDER_SITE_OTHER): Payer: PRIVATE HEALTH INSURANCE | Admitting: Nurse Practitioner

## 2020-02-18 VITALS — BP 100/60 | HR 96 | Temp 97.3°F | Resp 16 | Ht 61.0 in | Wt 131.2 lb

## 2020-02-18 DIAGNOSIS — R519 Headache, unspecified: Secondary | ICD-10-CM | POA: Diagnosis not present

## 2020-02-18 DIAGNOSIS — E1165 Type 2 diabetes mellitus with hyperglycemia: Secondary | ICD-10-CM

## 2020-02-18 DIAGNOSIS — Z794 Long term (current) use of insulin: Secondary | ICD-10-CM | POA: Diagnosis not present

## 2020-02-18 DIAGNOSIS — E559 Vitamin D deficiency, unspecified: Secondary | ICD-10-CM

## 2020-02-18 LAB — POCT GLYCOSYLATED HEMOGLOBIN (HGB A1C): Hemoglobin A1C: 8.4 % — AB (ref 4.0–5.6)

## 2020-02-18 NOTE — Progress Notes (Signed)
[facility] [place], [place] [address]  Internal MEDICINE  Office Visit Note  Patient Name: Kristin Cruz  [id number]  [id number]  Date of Service: 02/18/2020  Chief Complaint  Patient presents with  . Diabetes  . Gastroesophageal Reflux  . Hyperlipidemia    The patient is here for follow up of diabetes. She states that she is doing well, taking her oral medication and her insulin every day as prescribed. Her HgbA1c is 8.4 today, down from 10.7 at her last check. She feels good. Admits to getting some intermittent headaches. Last for a few hours then resolve on their own. Never has to take anything for them. Do not cause her to miss work or other important activities. She denies chest pain, chest pressure, or shortness of breath. She is overdue for diabetic eye exam.       Current Medication: Outpatient Encounter Medications as of 02/18/2020  Medication Sig  . Accu-Chek FastClix Lancets MISC Use as directed to check sugars twice daily. Dx e11.65  . aspirin 81 MG chewable tablet Chew by mouth.  Marland Kitchen glucose blood (ACCU-CHEK GUIDE) test strip Use as instructed twice a day diag E11.65  . insulin degludec (TRESIBA FLEXTOUCH) 100 UNIT/ML SOPN FlexTouch Pen Inject 0.2 mLs (20 Units total) into the skin daily.  . Insulin Pen Needle (PEN NEEDLES) 32G X 4 MM MISC 1 each by Does not apply route daily. To use with insulin dosing up to three times daily as needed.  E11.9  . metFORMIN (GLUCOPHAGE) 500 MG tablet Take 1 tablet (500 mg total) by mouth 2 (two) times daily with a meal.  . omeprazole (PRILOSEC) 40 MG capsule TAKE 1 TABLET ONCE A DAY 30 MINUTES BEFORE MEAL  . rosuvastatin (CRESTOR) 10 MG tablet Take 1 tablet (10 mg total) by mouth daily.  . Vitamin D, Ergocalciferol, (DRISDOL) 1.25 MG (50000 UT) CAPS capsule TAKE ONE CAPSULE BY MOUTH WEEKLY FOR LOW VITAMIN D   No facility-administered encounter medications on file as of 02/18/2020.    Surgical  History: History reviewed. No pertinent surgical history.  Medical History: Past Medical History:  Diagnosis Date  . Diabetes mellitus without complication ([place])   . GERD (gastroesophageal reflux disease)   . High cholesterol     Family History: Family History  Problem Relation Age of Onset  . Breast cancer Neg Hx     Social History   Socioeconomic History  . Marital status: Legally Separated    Spouse name: Not on file  . Number of children: Not on file  . Years of education: Not on file  . Highest education level: Not on file  Occupational History  . Not on file  Tobacco Use  . Smoking status: Never Smoker  . Smokeless tobacco: Current User    Types: Snuff  Substance and Sexual Activity  . Alcohol use: Not Currently  . Drug use: Never  . Sexual activity: Not on file  Other Topics Concern  . Not on file  Social History Narrative  . Not on file   Social Determinants of Health   Financial Resource Strain:   . Difficulty of Paying Living Expenses:   Food Insecurity:   . Worried About Charity fundraiser in the Last Year:   . Arboriculturist in the Last Year:   Transportation Needs:   . Film/video editor (Medical):   Marland Kitchen Lack of Transportation (Non-Medical):   Physical Activity:   . Days of Exercise per  Week:   . Minutes of Exercise per Session:   Stress:   . Feeling of Stress :   Social Connections:   . Frequency of Communication with Friends and Family:   . Frequency of Social Gatherings with Friends and Family:   . Attends Religious Services:   . Active Member of Clubs or Organizations:   . Attends Banker Meetings:   Marland Kitchen Marital Status:   Intimate Partner Violence:   . Fear of Current or Ex-Partner:   . Emotionally Abused:   Marland Kitchen Physically Abused:   . Sexually Abused:       Review of Systems  Constitutional: Negative for activity change, chills, fatigue and unexpected weight change.  HENT: Negative for congestion, postnasal drip,  rhinorrhea, sneezing and sore throat.   Respiratory: Negative for cough, chest tightness, shortness of breath and wheezing.   Cardiovascular: Negative for chest pain and palpitations.  Gastrointestinal: Negative for abdominal pain, constipation, diarrhea, nausea and vomiting.  Endocrine: Negative for cold intolerance, heat intolerance, polydipsia and polyuria.       Improved blood sugars since her last visit .  Musculoskeletal: Negative for arthralgias, back pain, joint swelling and neck pain.  Skin: Negative for rash.  Allergic/Immunologic: Negative for environmental allergies.  Neurological: Positive for headaches. Negative for tremors and numbness.       Intermittent headaches. Resolve on their own after a few hours.   Hematological: Negative for adenopathy. Does not bruise/bleed easily.  Psychiatric/Behavioral: Negative for behavioral problems (Depression), sleep disturbance and suicidal ideas. The patient is not nervous/anxious.     Today's Vitals   02/18/20 1006  BP: 100/60  Pulse: 96  Resp: 16  Temp: (!) 97.3 F (36.3 C)  SpO2: 97%  Weight: 131 lb 3.2 oz (59.5 kg)  Height: 5\' 1"  (1.549 m)   Body mass index is 24.79 kg/m.   Physical Exam Vitals and nursing note reviewed.  Constitutional:      General: She is not in acute distress.    Appearance: Normal appearance. She is well-developed. She is not diaphoretic.  HENT:     Head: Normocephalic and atraumatic.     Mouth/Throat:     Pharynx: No oropharyngeal exudate.  Eyes:     Pupils: Pupils are equal, round, and reactive to light.  Neck:     Thyroid: No thyromegaly.     Vascular: No carotid bruit or JVD.     Trachea: No tracheal deviation.  Cardiovascular:     Rate and Rhythm: Normal rate and regular rhythm.     Heart sounds: Normal heart sounds. No murmur. No friction rub. No gallop.   Pulmonary:     Effort: Pulmonary effort is normal. No respiratory distress.     Breath sounds: Normal breath sounds. No wheezing  or rales.  Chest:     Chest wall: No tenderness.  Abdominal:     Palpations: Abdomen is soft.  Musculoskeletal:        General: Normal range of motion.     Cervical back: Normal range of motion and neck supple.  Lymphadenopathy:     Cervical: No cervical adenopathy.  Skin:    General: Skin is warm and dry.  Neurological:     General: No focal deficit present.     Mental Status: She is alert and oriented to person, place, and time.     Cranial Nerves: No cranial nerve deficit.  Psychiatric:        Behavior: Behavior normal.  Thought Content: Thought content normal.        Judgment: Judgment normal.    Assessment/Plan:  1. Type 2 diabetes mellitus with hyperglycemia, with long-term current use of insulin (HCC) - POCT HgB A1C 8.4 today, down from 10.7 at last check. Continue all diabetic medication as prescribed. Refer for diabetic eye exam . - Ambulatory referral to Ophthalmology  2. Vitamin D deficiency contniue drisdol as prescribed   3. Nonintractable headache, unspecified chronicity pattern, unspecified headache type Recommend tylenol as needed and as indicated for acute headache. Keep journal/log of headaches and patterns so cause may be determined.   General Counseling: christyl osentoski understanding of the findings of todays visit and agrees with plan of treatment. I have discussed any further diagnostic evaluation that may be needed or ordered today. We also reviewed her medications today. she has been encouraged to call the office with any questions or concerns that should arise related to todays visit.  Diabetes Counseling:  1. Addition of ACE inh/ ARB'S for nephroprotection. Microalbumin is updated  2. Diabetic foot care, prevention of complications. Podiatry consult 3. Exercise and lose weight.  4. Diabetic eye examination, Diabetic eye exam is updated  5. Monitor blood sugar closlely. nutrition counseling.  6. Sign and symptoms of hypoglycemia including  shaking sweating,confusion and headaches.  This patient was seen by Vincent Gros FNP Collaboration with Dr Lyndon Code as a part of collaborative care agreement  Orders Placed This Encounter  Procedures  . Ambulatory referral to Ophthalmology  . POCT HgB A1C     Total time spent: 30 Minutes   Time spent includes review of chart, medications, test results, and follow up plan with the patient.      Dr Lyndon Code Internal medicine

## 2020-03-05 ENCOUNTER — Other Ambulatory Visit: Payer: Self-pay

## 2020-03-05 MED ORDER — ACCU-CHEK GUIDE VI STRP: ORAL_STRIP | 1 refills | Status: DC

## 2020-03-21 ENCOUNTER — Other Ambulatory Visit: Payer: Self-pay

## 2020-03-21 MED ORDER — ACCU-CHEK FASTCLIX LANCETS MISC: 3 refills | Status: DC

## 2020-05-13 ENCOUNTER — Other Ambulatory Visit: Payer: Self-pay

## 2020-05-13 MED ORDER — OMEPRAZOLE 40 MG PO CPDR: DELAYED_RELEASE_CAPSULE | ORAL | 1 refills | Status: DC

## 2020-05-16 ENCOUNTER — Telehealth: Payer: Self-pay

## 2020-05-16 NOTE — Telephone Encounter (Signed)
Confirmed and screened for 05-20-20 ov. Has not been vaccinated. 

## 2020-05-20 ENCOUNTER — Ambulatory Visit: Payer: PRIVATE HEALTH INSURANCE | Admitting: Nurse Practitioner

## 2020-05-27 ENCOUNTER — Telehealth: Payer: Self-pay

## 2020-05-27 NOTE — Telephone Encounter (Signed)
CONFIRMED PATIENT APPT. -AR °

## 2020-05-29 ENCOUNTER — Encounter: Payer: Self-pay | Admitting: Nurse Practitioner

## 2020-05-29 ENCOUNTER — Other Ambulatory Visit: Payer: Self-pay

## 2020-05-29 ENCOUNTER — Ambulatory Visit: Payer: PRIVATE HEALTH INSURANCE | Admitting: Nurse Practitioner

## 2020-05-29 VITALS — BP 104/63 | HR 84 | Temp 97.6°F | Resp 16 | Ht 61.0 in | Wt 129.0 lb

## 2020-05-29 DIAGNOSIS — Z794 Long term (current) use of insulin: Secondary | ICD-10-CM

## 2020-05-29 DIAGNOSIS — E1165 Type 2 diabetes mellitus with hyperglycemia: Secondary | ICD-10-CM

## 2020-05-29 DIAGNOSIS — E782 Mixed hyperlipidemia: Secondary | ICD-10-CM

## 2020-05-29 LAB — POCT GLYCOSYLATED HEMOGLOBIN (HGB A1C): Hemoglobin A1C: 12.5 % — AB (ref 4.0–5.6)

## 2020-05-29 NOTE — Progress Notes (Signed)
[facility] [address] [place], [place] [address]  Internal MEDICINE  Office Visit Note  Patient Name: Kristin Cruz  [id number]  [id number]  Date of Service: 06/11/2020  Chief Complaint  Patient presents with  . Follow-up  . Diabetes  . Gastroesophageal Reflux  . Quality Metric Gaps    TDAP, Colonoscopy    The patient is here for routine follow up. Blood sugars have been very elevated recently. States that she stopped taking her insulin for several weeks. She does not have good explanation for why she stopped taking it. Has been feeling tired and very thirsty. She is getting muscle cramps intermittently. The past few days, she restarted it. Has already noted her blood sugars are improved some. She is gradually increasing her dose to prescribed 20unit daily dose. She continues to feel fatigued with some muscle cramps. Improving slowly.       Current Medication: Outpatient Encounter Medications as of 05/29/2020  Medication Sig  . Accu-Chek FastClix Lancets MISC Use as directed to check sugars twice daily. Dx e11.65  . aspirin 81 MG chewable tablet Chew by mouth.  Marland Kitchen glucose blood (ACCU-CHEK GUIDE) test strip Use as instructed twice a day diag E11.65  . insulin degludec (TRESIBA FLEXTOUCH) 100 UNIT/ML SOPN FlexTouch Pen Inject 0.2 mLs (20 Units total) into the skin daily.  . Insulin Pen Needle (PEN NEEDLES) 32G X 4 MM MISC 1 each by Does not apply route daily. To use with insulin dosing up to three times daily as needed.  E11.9  . metFORMIN (GLUCOPHAGE) 500 MG tablet Take 1 tablet (500 mg total) by mouth 2 (two) times daily with a meal.  . omeprazole (PRILOSEC) 40 MG capsule TAKE 1 TABLET ONCE A DAY 30 MINUTES BEFORE MEAL  . rosuvastatin (CRESTOR) 10 MG tablet Take 1 tablet (10 mg total) by mouth daily.  . Vitamin D, Ergocalciferol, (DRISDOL) 1.25 MG (50000 UT) CAPS capsule TAKE ONE CAPSULE BY MOUTH WEEKLY FOR LOW VITAMIN D   No facility-administered encounter  medications on file as of 05/29/2020.    Surgical History: History reviewed. No pertinent surgical history.  Medical History: Past Medical History:  Diagnosis Date  . Diabetes mellitus without complication (HCC)   . GERD (gastroesophageal reflux disease)   . High cholesterol     Family History: Family History  Problem Relation Age of Onset  . Breast cancer Neg Hx     Social History   Socioeconomic History  . Marital status: Legally Separated    Spouse name: Not on file  . Number of children: Not on file  . Years of education: Not on file  . Highest education level: Not on file  Occupational History  . Not on file  Tobacco Use  . Smoking status: Never Smoker  . Smokeless tobacco: Current User    Types: Snuff  Vaping Use  . Vaping Use: Never used  Substance and Sexual Activity  . Alcohol use: Not Currently  . Drug use: Never  . Sexual activity: Not on file  Other Topics Concern  . Not on file  Social History Narrative  . Not on file   Social Determinants of Health   Financial Resource Strain:   . Difficulty of Paying Living Expenses:   Food Insecurity:   . Worried About Programme researcher, broadcasting/film/video in the Last Year:   . Barista in the Last Year:   Transportation Needs:   . Freight forwarder (Medical):   Marland Kitchen Lack  of Transportation (Non-Medical):   Physical Activity:   . Days of Exercise per Week:   . Minutes of Exercise per Session:   Stress:   . Feeling of Stress :   Social Connections:   . Frequency of Communication with Friends and Family:   . Frequency of Social Gatherings with Friends and Family:   . Attends Religious Services:   . Active Member of Clubs or Organizations:   . Attends Banker Meetings:   Marland Kitchen Marital Status:   Intimate Partner Violence:   . Fear of Current or Ex-Partner:   . Emotionally Abused:   Marland Kitchen Physically Abused:   . Sexually Abused:       Review of Systems  Constitutional: Positive for fatigue. Negative for  activity change, chills and unexpected weight change.  HENT: Negative for congestion, postnasal drip, rhinorrhea, sneezing and sore throat.   Respiratory: Negative for cough, chest tightness, shortness of breath and wheezing.   Cardiovascular: Negative for chest pain and palpitations.  Gastrointestinal: Positive for nausea. Negative for abdominal pain, constipation, diarrhea and vomiting.  Endocrine: Positive for polydipsia. Negative for cold intolerance, heat intolerance and polyuria.       Blood sugars very high. Went for several weeks without taking insulin. Continued taking metformin throughout.  Musculoskeletal: Positive for myalgias. Negative for arthralgias, back pain, joint swelling and neck pain.  Skin: Negative for rash.  Allergic/Immunologic: Negative for environmental allergies.  Neurological: Positive for headaches. Negative for tremors and numbness.       Intermittent headaches. Resolve on their own after a few hours.   Hematological: Negative for adenopathy. Does not bruise/bleed easily.  Psychiatric/Behavioral: Negative for behavioral problems (Depression), sleep disturbance and suicidal ideas. The patient is not nervous/anxious.     Today's Vitals   05/29/20 1053  BP: 104/63  Pulse: 84  Resp: 16  Temp: 97.6 F (36.4 C)  SpO2: 99%  Weight: 129 lb (58.5 kg)  Height: 5\' 1"  (1.549 m)   Body mass index is 24.37 kg/m.  Physical Exam Vitals and nursing note reviewed.  Constitutional:      General: She is not in acute distress.    Appearance: Normal appearance. She is well-developed. She is not diaphoretic.  HENT:     Head: Normocephalic and atraumatic.     Mouth/Throat:     Pharynx: No oropharyngeal exudate.  Eyes:     Pupils: Pupils are equal, round, and reactive to light.  Neck:     Thyroid: No thyromegaly.     Vascular: No carotid bruit or JVD.     Trachea: No tracheal deviation.  Cardiovascular:     Rate and Rhythm: Normal rate and regular rhythm.      Heart sounds: Normal heart sounds. No murmur heard.  No friction rub. No gallop.   Pulmonary:     Effort: Pulmonary effort is normal. No respiratory distress.     Breath sounds: Normal breath sounds. No wheezing or rales.  Chest:     Chest wall: No tenderness.  Abdominal:     Palpations: Abdomen is soft.  Musculoskeletal:        General: Normal range of motion.     Cervical back: Normal range of motion and neck supple.  Lymphadenopathy:     Cervical: No cervical adenopathy.  Skin:    General: Skin is warm and dry.  Neurological:     General: No focal deficit present.     Mental Status: She is alert and oriented to person, place,  and time.     Cranial Nerves: No cranial nerve deficit.  Psychiatric:        Mood and Affect: Mood normal.        Behavior: Behavior normal.        Thought Content: Thought content normal.        Judgment: Judgment normal.    Assessment/Plan:  1. Type 2 diabetes mellitus with hyperglycemia, with long-term current use of insulin (HCC) - POCT HgB A1C significantly elevated at 12.5 today. Patient reporting she has not been taking insulin in some time. Restarted few days ago. Gradually working back to 20 units daily. Continues to take metformin daily. Recommended she strongly limit intake of carbohydrates and sugar and increase protein in the diet.   2. Mixed hyperlipidemia Continue crestor as prescribed.   General Counseling: lota leamer understanding of the findings of todays visit and agrees with plan of treatment. I have discussed any further diagnostic evaluation that may be needed or ordered today. We also reviewed her medications today. she has been encouraged to call the office with any questions or concerns that should arise related to todays visit.   Diabetes Counseling:  1. Addition of ACE inh/ ARB'S for nephroprotection. Microalbumin is updated  2. Diabetic foot care, prevention of complications. Podiatry consult 3. Exercise and lose  weight.  4. Diabetic eye examination, Diabetic eye exam is updated  5. Monitor blood sugar closlely. nutrition counseling.  6. Sign and symptoms of hypoglycemia including shaking sweating,confusion and headaches.  This patient was seen by Vincent Gros FNP Collaboration with Dr Lyndon Code as a part of collaborative care agreement  Orders Placed This Encounter  Procedures  . POCT HgB A1C      Total time spent: 30 Minutes   Time spent includes review of chart, medications, test results, and follow up plan with the patient.      Dr Lyndon Code Internal medicine

## 2020-06-26 ENCOUNTER — Other Ambulatory Visit: Payer: Self-pay

## 2020-06-26 MED ORDER — OMEPRAZOLE 40 MG PO CPDR: DELAYED_RELEASE_CAPSULE | ORAL | 1 refills | Status: DC

## 2020-07-15 ENCOUNTER — Encounter: Payer: Self-pay | Admitting: Nurse Practitioner

## 2020-07-15 ENCOUNTER — Other Ambulatory Visit: Payer: Self-pay

## 2020-07-15 ENCOUNTER — Ambulatory Visit: Payer: No Typology Code available for payment source | Admitting: Nurse Practitioner

## 2020-07-15 VITALS — BP 108/62 | HR 77 | Temp 97.7°F | Resp 16 | Ht 61.0 in | Wt 133.8 lb

## 2020-07-15 DIAGNOSIS — E1165 Type 2 diabetes mellitus with hyperglycemia: Secondary | ICD-10-CM | POA: Diagnosis not present

## 2020-07-15 DIAGNOSIS — Z794 Long term (current) use of insulin: Secondary | ICD-10-CM

## 2020-07-15 DIAGNOSIS — L602 Onychogryphosis: Secondary | ICD-10-CM | POA: Diagnosis not present

## 2020-07-15 NOTE — Progress Notes (Signed)
[facility] [address] [place], [place] [address]  Internal MEDICINE  Office Visit Note  Patient Name: Kristin Cruz  [id number]  [id number]  Date of Service: 07/15/2020  Chief Complaint  Patient presents with  . Acute Visit    both big toes are black  . Medication Refill     The patient presents for acute visit today.  She has discoloration and thickening of the great toenails of both feet. She states that toenails have been like this for several months. She denies pain. There is no swelling, redness, or evidence of infection. She states that her blood sugars are doing better. They are generally running around 100. She is due to have check of HgBa1C at her next visit.   Pt is here for a sick visit.     Current Medication:  Outpatient Encounter Medications as of 07/15/2020  Medication Sig  . Accu-Chek FastClix Lancets MISC Use as directed to check sugars twice daily. Dx e11.65  . aspirin 81 MG chewable tablet Chew by mouth.  Marland Kitchen glucose blood (ACCU-CHEK GUIDE) test strip Use as instructed twice a day diag E11.65  . insulin degludec (TRESIBA FLEXTOUCH) 100 UNIT/ML SOPN FlexTouch Pen Inject 0.2 mLs (20 Units total) into the skin daily.  . Insulin Pen Needle (PEN NEEDLES) 32G X 4 MM MISC 1 each by Does not apply route daily. To use with insulin dosing up to three times daily as needed.  E11.9  . metFORMIN (GLUCOPHAGE) 500 MG tablet Take 1 tablet (500 mg total) by mouth 2 (two) times daily with a meal.  . omeprazole (PRILOSEC) 40 MG capsule TAKE 1 TABLET ONCE A DAY 30 MINUTES BEFORE MEAL  . rosuvastatin (CRESTOR) 10 MG tablet Take 1 tablet (10 mg total) by mouth daily.  . Vitamin D, Ergocalciferol, (DRISDOL) 1.25 MG (50000 UT) CAPS capsule TAKE ONE CAPSULE BY MOUTH WEEKLY FOR LOW VITAMIN D   No facility-administered encounter medications on file as of 07/15/2020.      Medical History: Past Medical History:  Diagnosis Date  . Diabetes mellitus without  complication (HCC)   . GERD (gastroesophageal reflux disease)   . High cholesterol      Today's Vitals   07/15/20 0903  BP: 108/62  Pulse: 77  Resp: 16  Temp: 97.7 F (36.5 C)  SpO2: 96%  Weight: 133 lb 12.8 oz (60.7 kg)  Height: 5\' 1"  (1.549 m)   Body mass index is 25.28 kg/m.  Review of Systems  Constitutional: Negative for chills, fatigue and unexpected weight change.  HENT: Negative for congestion, postnasal drip, rhinorrhea, sneezing and sore throat.   Respiratory: Negative for cough, chest tightness and shortness of breath.   Cardiovascular: Negative for chest pain and palpitations.  Gastrointestinal: Negative for abdominal pain, constipation, diarrhea, nausea and vomiting.  Musculoskeletal: Negative for arthralgias, back pain, joint swelling and neck pain.  Skin: Negative for rash.       discoloration and thickening of the great toenails of both feet.   Neurological: Negative for dizziness, tremors, numbness and headaches.  Hematological: Negative for adenopathy. Does not bruise/bleed easily.  Psychiatric/Behavioral: Negative for behavioral problems (Depression), sleep disturbance and suicidal ideas. The patient is not nervous/anxious.     Physical Exam Vitals and nursing note reviewed.  Constitutional:      General: She is not in acute distress.    Appearance: Normal appearance. She is well-developed. She is not diaphoretic.  HENT:     Head: Normocephalic and atraumatic.  Mouth/Throat:     Pharynx: No oropharyngeal exudate.  Eyes:     Pupils: Pupils are equal, round, and reactive to light.  Neck:     Thyroid: No thyromegaly.     Vascular: No JVD.     Trachea: No tracheal deviation.  Cardiovascular:     Rate and Rhythm: Normal rate and regular rhythm.     Heart sounds: Normal heart sounds. No murmur heard.  No friction rub. No gallop.   Pulmonary:     Effort: Pulmonary effort is normal. No respiratory distress.     Breath sounds: Normal breath sounds.  No wheezing or rales.  Chest:     Chest wall: No tenderness.  Abdominal:     Palpations: Abdomen is soft.  Musculoskeletal:        General: Normal range of motion.     Cervical back: Normal range of motion and neck supple.       Feet:  Feet:     Comments: The patient has dark color with thick toenails of great toes of both feet. This is non tender. There is no redness, warmth, or swelling present. There is small amount of inflammation present to the nail bed of the left great toe.  Lymphadenopathy:     Cervical: No cervical adenopathy.  Skin:    General: Skin is warm and dry.  Neurological:     Mental Status: She is alert and oriented to person, place, and time.     Cranial Nerves: No cranial nerve deficit.  Psychiatric:        Mood and Affect: Mood normal.        Behavior: Behavior normal.        Thought Content: Thought content normal.        Judgment: Judgment normal.    Assessment/Plan: 1. Hypertrophic toenail discoloration and thickening of the great toenails of both feet.  Patient diabetic. Will refer to podiatry for further evaluation and treatment.  - Ambulatory referral to Podiatry  2. Type 2 diabetes mellitus with hyperglycemia, with long-term current use of insulin (HCC) discoloration and thickening of the great toenails of both feet.  Patient diabetic. Will refer to podiatry for further evaluation and treatment.  - Ambulatory referral to Podiatry  General Counseling: Dorathy Kinsman understanding of the findings of todays visit and agrees with plan of treatment. I have discussed any further diagnostic evaluation that may be needed or ordered today. We also reviewed her medications today. she has been encouraged to call the office with any questions or concerns that should arise related to todays visit.    Counseling:  This patient was seen by Vincent Gros FNP Collaboration with Dr Lyndon Code as a part of collaborative care agreement  Orders Placed This  Encounter  Procedures  . Ambulatory referral to Podiatry      Time spent: 30 Minutes

## 2020-08-20 ENCOUNTER — Other Ambulatory Visit: Payer: Self-pay

## 2020-08-20 DIAGNOSIS — E1142 Type 2 diabetes mellitus with diabetic polyneuropathy: Secondary | ICD-10-CM

## 2020-08-20 MED ORDER — TRESIBA FLEXTOUCH 100 UNIT/ML ~~LOC~~ SOPN: 20.0000 [IU] | PEN_INJECTOR | Freq: Every day | SUBCUTANEOUS | 1 refills | Status: DC

## 2020-08-28 ENCOUNTER — Ambulatory Visit: Payer: PRIVATE HEALTH INSURANCE | Admitting: Nurse Practitioner

## 2020-09-11 ENCOUNTER — Other Ambulatory Visit: Payer: Self-pay

## 2020-09-11 MED ORDER — OMEPRAZOLE 40 MG PO CPDR: DELAYED_RELEASE_CAPSULE | ORAL | 1 refills | Status: DC

## 2020-09-16 ENCOUNTER — Other Ambulatory Visit: Payer: Self-pay

## 2020-09-16 MED ORDER — OMEPRAZOLE 40 MG PO CPDR: DELAYED_RELEASE_CAPSULE | ORAL | 3 refills | Status: DC

## 2020-09-18 ENCOUNTER — Ambulatory Visit: Payer: PRIVATE HEALTH INSURANCE | Admitting: Internal Medicine

## 2020-09-19 ENCOUNTER — Telehealth: Payer: Self-pay

## 2020-09-19 NOTE — Telephone Encounter (Signed)
Authorization approved for Omeprazole from 09/19/2020 through 09/19/2021 [facility]

## 2020-09-24 ENCOUNTER — Ambulatory Visit (INDEPENDENT_AMBULATORY_CARE_PROVIDER_SITE_OTHER): Payer: No Typology Code available for payment source | Admitting: Nurse Practitioner

## 2020-09-24 ENCOUNTER — Encounter: Payer: Self-pay | Admitting: Nurse Practitioner

## 2020-09-24 ENCOUNTER — Other Ambulatory Visit: Payer: Self-pay

## 2020-09-24 VITALS — BP 98/64 | HR 88 | Temp 97.5°F | Resp 16 | Ht 61.0 in | Wt 136.8 lb

## 2020-09-24 DIAGNOSIS — Z794 Long term (current) use of insulin: Secondary | ICD-10-CM

## 2020-09-24 DIAGNOSIS — E782 Mixed hyperlipidemia: Secondary | ICD-10-CM | POA: Diagnosis not present

## 2020-09-24 DIAGNOSIS — E1165 Type 2 diabetes mellitus with hyperglycemia: Secondary | ICD-10-CM

## 2020-09-24 DIAGNOSIS — Z1231 Encounter for screening mammogram for malignant neoplasm of breast: Secondary | ICD-10-CM | POA: Diagnosis not present

## 2020-09-24 LAB — POCT GLYCOSYLATED HEMOGLOBIN (HGB A1C): Hemoglobin A1C: 10.3 % — AB (ref 4.0–5.6)

## 2020-09-24 NOTE — Progress Notes (Signed)
[facility] [address] [place], [place] [address]  Internal MEDICINE  Office Visit Note  Patient Name: Kristin Cruz  [id number]  [id number]  Date of Service: 10/19/2020  Chief Complaint  Patient presents with  . Follow-up  . Diabetes  . Hyperlipidemia  . policy update form    received    The patient is here for follow up visit. Blood sugars remain moderately elevated, however, much improved from previous check. Today, her HgbA1c is 10.3. was 12.5 at last visit. She states that she did run out of insulin for more than two weeks. States that she felt bad when she did not have it. States that she did not notify anyone of lack of insulin for a full two weeks. Once notified, was able to get a sample of Evaristo Bury for the patient and a new prescription was sent to the pharmacy and filled. She restarted her insulin dosing about one week ago. She states that she is already feeling much better. She continues to take metformin 500mg  twice daily.  She states that she is due to have her screening mammogram. Did get a letter from mammogram center in [place]. Needs to call and make appointment. She has no new concerns or complaints today.       Current Medication: Outpatient Encounter Medications as of 09/24/2020  Medication Sig  . Accu-Chek FastClix Lancets MISC Use as directed to check sugars twice daily. Dx e11.65  . aspirin 81 MG chewable tablet Chew by mouth.  13/08/2020 glucose blood (ACCU-CHEK GUIDE) test strip Use as instructed twice a day diag E11.65  . insulin degludec (TRESIBA FLEXTOUCH) 100 UNIT/ML FlexTouch Pen Inject 20 Units into the skin daily.  . Insulin Pen Needle (PEN NEEDLES) 32G X 4 MM MISC 1 each by Does not apply route daily. To use with insulin dosing up to three times daily as needed.  E11.9  . metFORMIN (GLUCOPHAGE) 500 MG tablet Take 1 tablet (500 mg total) by mouth 2 (two) times daily with a meal.  . omeprazole (PRILOSEC) 40 MG capsule TAKE 1 TABLET ONCE A  DAY 30 MINUTES BEFORE MEAL  . rosuvastatin (CRESTOR) 10 MG tablet Take 1 tablet (10 mg total) by mouth daily.  . Vitamin D, Ergocalciferol, (DRISDOL) 1.25 MG (50000 UT) CAPS capsule TAKE ONE CAPSULE BY MOUTH WEEKLY FOR LOW VITAMIN D   No facility-administered encounter medications on file as of 09/24/2020.    Surgical History: History reviewed. No pertinent surgical history.  Medical History: Past Medical History:  Diagnosis Date  . Diabetes mellitus without complication (HCC)   . GERD (gastroesophageal reflux disease)   . High cholesterol     Family History: Family History  Problem Relation Age of Onset  . Breast cancer Neg Hx     Social History   Socioeconomic History  . Marital status: Legally Separated    Spouse name: Not on file  . Number of children: Not on file  . Years of education: Not on file  . Highest education level: Not on file  Occupational History  . Not on file  Tobacco Use  . Smoking status: Never Smoker  . Smokeless tobacco: Current User    Types: Snuff  Vaping Use  . Vaping Use: Never used  Substance and Sexual Activity  . Alcohol use: Not Currently  . Drug use: Never  . Sexual activity: Not on file  Other Topics Concern  . Not on file  Social History Narrative  . Not on file  Social Determinants of Health   Financial Resource Strain:   . Difficulty of Paying Living Expenses: Not on file  Food Insecurity:   . Worried About Programme researcher, broadcasting/film/video in the Last Year: Not on file  . Ran Out of Food in the Last Year: Not on file  Transportation Needs:   . Lack of Transportation (Medical): Not on file  . Lack of Transportation (Non-Medical): Not on file  Physical Activity:   . Days of Exercise per Week: Not on file  . Minutes of Exercise per Session: Not on file  Stress:   . Feeling of Stress : Not on file  Social Connections:   . Frequency of Communication with Friends and Family: Not on file  . Frequency of Social Gatherings with Friends  and Family: Not on file  . Attends Religious Services: Not on file  . Active Member of Clubs or Organizations: Not on file  . Attends Banker Meetings: Not on file  . Marital Status: Not on file  Intimate Partner Violence:   . Fear of Current or Ex-Partner: Not on file  . Emotionally Abused: Not on file  . Physically Abused: Not on file  . Sexually Abused: Not on file      Review of Systems  Constitutional: Positive for fatigue. Negative for activity change, chills and unexpected weight change.  HENT: Negative for congestion, postnasal drip, rhinorrhea, sneezing and sore throat.   Respiratory: Negative for cough, chest tightness, shortness of breath and wheezing.   Cardiovascular: Negative for chest pain and palpitations.  Gastrointestinal: Negative for abdominal pain, constipation, diarrhea, nausea and vomiting.  Endocrine: Negative for cold intolerance, heat intolerance, polydipsia and polyuria.       Blood sugars elevated over last few weeks, but overall, doing some better than before.   Musculoskeletal: Negative for arthralgias, back pain, joint swelling and neck pain.  Skin: Negative for rash.  Neurological: Negative for dizziness, tremors, numbness and headaches.  Hematological: Negative for adenopathy. Does not bruise/bleed easily.  Psychiatric/Behavioral: Negative for behavioral problems (Depression), dysphoric mood, sleep disturbance and suicidal ideas. The patient is not nervous/anxious.     Today's Vitals   09/24/20 1120  BP: 98/64  Pulse: 88  Resp: 16  Temp: (!) 97.5 F (36.4 C)  SpO2: 98%  Weight: 136 lb 12.8 oz (62.1 kg)  Height: 5\' 1"  (1.549 m)   Body mass index is 25.85 kg/m.  Physical Exam Vitals and nursing note reviewed.  Constitutional:      General: She is not in acute distress.    Appearance: Normal appearance. She is well-developed. She is not diaphoretic.  HENT:     Head: Normocephalic and atraumatic.     Nose: Nose normal.      Mouth/Throat:     Pharynx: No oropharyngeal exudate.  Eyes:     Pupils: Pupils are equal, round, and reactive to light.  Neck:     Thyroid: No thyromegaly.     Vascular: No JVD.     Trachea: No tracheal deviation.  Cardiovascular:     Rate and Rhythm: Normal rate and regular rhythm.     Heart sounds: Normal heart sounds. No murmur heard.  No friction rub. No gallop.   Pulmonary:     Effort: Pulmonary effort is normal. No respiratory distress.     Breath sounds: Normal breath sounds. No wheezing or rales.  Chest:     Chest wall: No tenderness.  Abdominal:     Palpations: Abdomen is  soft.  Musculoskeletal:        General: Normal range of motion.     Cervical back: Normal range of motion and neck supple.  Lymphadenopathy:     Cervical: No cervical adenopathy.  Skin:    General: Skin is warm and dry.     Capillary Refill: Capillary refill takes less than 2 seconds.  Neurological:     General: No focal deficit present.     Mental Status: She is alert and oriented to person, place, and time.     Cranial Nerves: No cranial nerve deficit.  Psychiatric:        Mood and Affect: Mood normal.        Behavior: Behavior normal.        Thought Content: Thought content normal.        Judgment: Judgment normal.    Assessment/Plan:  1. Type 2 diabetes mellitus with hyperglycemia, with long-term current use of insulin (HCC) - POCT HgB A1C 10.3 today, down from 12.5 at her most recent visit. Had been out of insulin for several weeks. Just restarted this past week. Continue Tresiba 20 units daily and metformin as previously prescribed .  2. Mixed hyperlipidemia Continue rosuvastatin as prescribed   3. Encounter for screening mammogram for malignant neoplasm of breast - MM DIGITAL SCREENING BILATERAL; Future   General Counseling: malie kashani understanding of the findings of todays visit and agrees with plan of treatment. I have discussed any further diagnostic evaluation that may  be needed or ordered today. We also reviewed her medications today. she has been encouraged to call the office with any questions or concerns that should arise related to todays visit.  Diabetes Counseling:  1. Addition of ACE inh/ ARB'S for nephroprotection. Microalbumin is updated  2. Diabetic foot care, prevention of complications. Podiatry consult 3. Exercise and lose weight.  4. Diabetic eye examination, Diabetic eye exam is updated  5. Monitor blood sugar closlely. nutrition counseling.  6. Sign and symptoms of hypoglycemia including shaking sweating,confusion and headaches.  This patient was seen by Vincent Gros FNP Collaboration with Dr Lyndon Code as a part of collaborative care agreement  Orders Placed This Encounter  Procedures  . MM DIGITAL SCREENING BILATERAL  . POCT HgB A1C      Total time spent: 30 Minutes   Time spent includes review of chart, medications, test results, and follow up plan with the patient.      Dr Lyndon Code Internal medicine

## 2020-11-21 ENCOUNTER — Encounter: Payer: Self-pay | Admitting: Nurse Practitioner

## 2020-11-21 ENCOUNTER — Ambulatory Visit (INDEPENDENT_AMBULATORY_CARE_PROVIDER_SITE_OTHER): Payer: No Typology Code available for payment source | Admitting: Hospice and Palliative Medicine

## 2020-11-21 ENCOUNTER — Other Ambulatory Visit: Payer: Self-pay

## 2020-11-21 VITALS — BP 108/70 | HR 80 | Temp 97.2°F | Resp 16 | Ht 61.25 in | Wt 137.4 lb

## 2020-11-21 DIAGNOSIS — E559 Vitamin D deficiency, unspecified: Secondary | ICD-10-CM

## 2020-11-21 DIAGNOSIS — R300 Dysuria: Secondary | ICD-10-CM

## 2020-11-21 DIAGNOSIS — E1165 Type 2 diabetes mellitus with hyperglycemia: Secondary | ICD-10-CM

## 2020-11-21 DIAGNOSIS — Z1231 Encounter for screening mammogram for malignant neoplasm of breast: Secondary | ICD-10-CM

## 2020-11-21 DIAGNOSIS — Z794 Long term (current) use of insulin: Secondary | ICD-10-CM

## 2020-11-21 DIAGNOSIS — E782 Mixed hyperlipidemia: Secondary | ICD-10-CM

## 2020-11-21 DIAGNOSIS — Z0001 Encounter for general adult medical examination with abnormal findings: Secondary | ICD-10-CM

## 2020-11-21 MED ORDER — VITAMIN D (ERGOCALCIFEROL) 1.25 MG (50000 UNIT) PO CAPS: ORAL_CAPSULE | ORAL | 3 refills | Status: DC

## 2020-11-21 MED ORDER — DAPAGLIFLOZIN PROPANEDIOL 5 MG PO TABS: 5.0000 mg | ORAL_TABLET | Freq: Every day | ORAL | 0 refills | Status: DC

## 2020-11-21 NOTE — Progress Notes (Cosign Needed)
[facility] [address] [place], [place] [address]  Internal MEDICINE  Office Visit Note  Patient Name: Kristin Cruz  [id number]  [id number]  Date of Service: 11/21/2020  Chief Complaint  Patient presents with  . Annual Exam  . Diabetes  . Gastroesophageal Reflux  . Hyperlipidemia     HPI Pt is here for routine health maintenance examination Overall, she has been doing well No recent changes in her mediations or history DM-does not routinely monitor glucose levels at home, Metformin seems to be causing her diarrhea and abdominal cramping Works full time, 3rd shift, sleeps fairly well during the day  Requesting refills of Vitamin D  PHM: Mammogram needs updating  Current Medication: Outpatient Encounter Medications as of 11/21/2020  Medication Sig  . Accu-Chek FastClix Lancets MISC Use as directed to check sugars twice daily. Dx e11.65  . aspirin 81 MG chewable tablet Chew by mouth.  . dapagliflozin propanediol (FARXIGA) 5 MG TABS tablet Take 1 tablet (5 mg total) by mouth daily before breakfast.  . glucose blood (ACCU-CHEK GUIDE) test strip Use as instructed twice a day diag E11.65  . insulin degludec (TRESIBA FLEXTOUCH) 100 UNIT/ML FlexTouch Pen Inject 20 Units into the skin daily.  . Insulin Pen Needle (PEN NEEDLES) 32G X 4 MM MISC 1 each by Does not apply route daily. To use with insulin dosing up to three times daily as needed.  E11.9  . metFORMIN (GLUCOPHAGE) 500 MG tablet Take 1 tablet (500 mg total) by mouth 2 (two) times daily with a meal.  . omeprazole (PRILOSEC) 40 MG capsule TAKE 1 TABLET ONCE A DAY 30 MINUTES BEFORE MEAL  . rosuvastatin (CRESTOR) 10 MG tablet Take 1 tablet (10 mg total) by mouth daily.  . [DISCONTINUED] Vitamin D, Ergocalciferol, (DRISDOL) 1.25 MG (50000 UT) CAPS capsule TAKE ONE CAPSULE BY MOUTH WEEKLY FOR LOW VITAMIN D  . Vitamin D, Ergocalciferol, (DRISDOL) 1.25 MG (50000 UNIT) CAPS capsule TAKE ONE CAPSULE BY MOUTH WEEKLY  FOR LOW VITAMIN D   No facility-administered encounter medications on file as of 11/21/2020.    Surgical History: History reviewed. No pertinent surgical history.  Medical History: Past Medical History:  Diagnosis Date  . Diabetes mellitus without complication (HCC)   . GERD (gastroesophageal reflux disease)   . High cholesterol     Family History: Family History  Problem Relation Age of Onset  . Breast cancer Neg Hx       Review of Systems  Constitutional: Negative for chills, diaphoresis and fatigue.  HENT: Negative for ear pain, postnasal drip and sinus pressure.   Eyes: Negative for photophobia, discharge, redness, itching and visual disturbance.  Respiratory: Negative for cough, shortness of breath and wheezing.   Cardiovascular: Negative for chest pain, palpitations and leg swelling.  Gastrointestinal: Positive for abdominal pain and diarrhea. Negative for constipation, nausea and vomiting.  Genitourinary: Negative for dysuria and flank pain.  Musculoskeletal: Negative for arthralgias, back pain, gait problem and neck pain.  Skin: Negative for color change.  Allergic/Immunologic: Negative for environmental allergies and food allergies.  Neurological: Negative for dizziness and headaches.  Hematological: Does not bruise/bleed easily.  Psychiatric/Behavioral: Negative for agitation, behavioral problems (depression) and hallucinations.     Vital Signs: BP 108/70   Pulse 80   Temp (!) 97.2 F (36.2 C)   Resp 16   Ht 5' 1.25" (1.556 m)   Wt 137 lb 6.4 oz (62.3 kg)   SpO2 98%   BMI 25.75 kg/m  Physical Exam Vitals reviewed.  Constitutional:      Appearance: Normal appearance. She is normal weight.  HENT:     Right Ear: Tympanic membrane normal.     Left Ear: Tympanic membrane normal.     Nose: Nose normal.     Mouth/Throat:     Mouth: Mucous membranes are moist.  Eyes:     Pupils: Pupils are equal, round, and reactive to light.  Cardiovascular:      Rate and Rhythm: Normal rate and regular rhythm.     Pulses: Normal pulses.     Heart sounds: Normal heart sounds.  Pulmonary:     Effort: Pulmonary effort is normal.     Breath sounds: Normal breath sounds.  Abdominal:     General: Abdomen is flat.  Musculoskeletal:        General: Normal range of motion.     Cervical back: Normal range of motion.  Skin:    General: Skin is warm.  Neurological:     General: No focal deficit present.     Mental Status: She is alert and oriented to person, place, and time. Mental status is at baseline.  Psychiatric:        Mood and Affect: Mood normal.        Behavior: Behavior normal.        Thought Content: Thought content normal.        Judgment: Judgment normal.    LABS: Recent Results (from the past 2160 hour(s))  POCT HgB A1C     Status: Abnormal   Collection Time: 09/24/20 11:48 AM  Result Value Ref Range   Hemoglobin A1C 10.3 (A) 4.0 - 5.6 %   HbA1c POC (<> result, manual entry)     HbA1c, POC (prediabetic range)     HbA1c, POC (controlled diabetic range)     Assessment/Plan: 1. Encounter for routine adult health examination with abnormal findings Well appearing 60 year old AA female, will review routine labs and adjust therapy as indicated - CBC w/Diff/Platelet - Comprehensive Metabolic Panel (CMET) - Lipid Panel With LDL/HDL Ratio - TSH + free T4  2. Type 2 diabetes mellitus with hyperglycemia, with long-term current use of insulin (HCC) Stop Metformin due to GI intolerance, start low dose Farxiga, advised to monitor fasting glucose levels for 1 week and call with readings, may increase dose to 10 mg at that time - dapagliflozin propanediol (FARXIGA) 5 MG TABS tablet; Take 1 tablet (5 mg total) by mouth daily before breakfast.  Dispense: 30 tablet; Refill: 0 - Comprehensive Metabolic Panel (CMET) - TSH + free T4  3. Encounter for screening mammogram for malignant neoplasm of breast - MM Digital Screening; Future  4. Vitamin  D deficiency Requesting refills, will review updated levels, has not been taking for several months - Vitamin D, Ergocalciferol, (DRISDOL) 1.25 MG (50000 UNIT) CAPS capsule; TAKE ONE CAPSULE BY MOUTH WEEKLY FOR LOW VITAMIN D  Dispense: 4 capsule; Refill: 3  5. Mixed hyperlipidemia Will review updated levels and adjust therapy as needed - CBC w/Diff/Platelet - Lipid Panel With LDL/HDL Ratio   General Counseling: Kristin Cruz verbalizes understanding of the findings of todays visit and agrees with plan of treatment. I have discussed any further diagnostic evaluation that may be needed or ordered today. We also reviewed her medications today. she has been encouraged to call the office with any questions or concerns that should arise related to todays visit.    Counseling:    Orders Placed This Encounter  Procedures  . MM Digital Screening  . CBC w/Diff/Platelet  . Comprehensive Metabolic Panel (CMET)  . Lipid Panel With LDL/HDL Ratio  . TSH + free T4    Meds ordered this encounter  Medications  . dapagliflozin propanediol (FARXIGA) 5 MG TABS tablet    Sig: Take 1 tablet (5 mg total) by mouth daily before breakfast.    Dispense:  30 tablet    Refill:  0  . Vitamin D, Ergocalciferol, (DRISDOL) 1.25 MG (50000 UNIT) CAPS capsule    Sig: TAKE ONE CAPSULE BY MOUTH WEEKLY FOR LOW VITAMIN D    Dispense:  4 capsule    Refill:  3    Total time spent: 30 Minutes  Time spent includes review of chart, medications, test results, and follow up plan with the patient.   This patient was seen by Casey Burkitt AGNP-C Collaboration with Dr Lavera Guise as a part of collaborative care agreement   Tanna Furry. [facility] Internal Medicine

## 2020-11-26 ENCOUNTER — Other Ambulatory Visit: Payer: Self-pay

## 2020-11-26 DIAGNOSIS — E782 Mixed hyperlipidemia: Secondary | ICD-10-CM

## 2020-11-26 MED ORDER — ROSUVASTATIN CALCIUM 10 MG PO TABS: 10.0000 mg | ORAL_TABLET | Freq: Every day | ORAL | 3 refills | Status: DC

## 2020-12-02 ENCOUNTER — Telehealth: Payer: Self-pay

## 2020-12-02 DIAGNOSIS — E1165 Type 2 diabetes mellitus with hyperglycemia: Secondary | ICD-10-CM

## 2020-12-02 MED ORDER — DAPAGLIFLOZIN PROPANEDIOL 5 MG PO TABS: 5.0000 mg | ORAL_TABLET | Freq: Every day | ORAL | 5 refills | Status: DC

## 2020-12-02 NOTE — Telephone Encounter (Signed)
[place] approved on 11/28/2020 for FARXIGA 5 mg tablet.  Valid thru 11/28/2020 to 11/29/2023 thru [employer] pharmacy benefit management

## 2020-12-18 ENCOUNTER — Ambulatory Visit (INDEPENDENT_AMBULATORY_CARE_PROVIDER_SITE_OTHER): Payer: No Typology Code available for payment source | Admitting: Hospice and Palliative Medicine

## 2020-12-18 ENCOUNTER — Encounter: Payer: Self-pay | Admitting: Hospice and Palliative Medicine

## 2020-12-18 VITALS — BP 104/63 | HR 98 | Temp 97.2°F | Resp 16 | Ht 61.0 in | Wt 134.6 lb

## 2020-12-18 DIAGNOSIS — Z794 Long term (current) use of insulin: Secondary | ICD-10-CM

## 2020-12-18 DIAGNOSIS — G4726 Circadian rhythm sleep disorder, shift work type: Secondary | ICD-10-CM

## 2020-12-18 DIAGNOSIS — E782 Mixed hyperlipidemia: Secondary | ICD-10-CM

## 2020-12-18 DIAGNOSIS — E1165 Type 2 diabetes mellitus with hyperglycemia: Secondary | ICD-10-CM

## 2020-12-18 DIAGNOSIS — R5383 Other fatigue: Secondary | ICD-10-CM

## 2020-12-18 DIAGNOSIS — Z79899 Other long term (current) drug therapy: Secondary | ICD-10-CM

## 2020-12-18 DIAGNOSIS — E038 Other specified hypothyroidism: Secondary | ICD-10-CM

## 2020-12-18 LAB — POCT GLYCOSYLATED HEMOGLOBIN (HGB A1C): Hemoglobin A1C: 10.9 % — AB (ref 4.0–5.6)

## 2020-12-18 NOTE — Progress Notes (Signed)
[facility] [address] [place], [place] [address]  Internal MEDICINE  Office Visit Note  Patient Name: Kristin Cruz  [id number]  [id number]  Date of Service: 12/22/2020  Chief Complaint  Patient presents with  . Diabetes  . Follow-up  . Hyperlipidemia  . 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, occasionally goes back to bed until around 4:30 pm will then wake up and have her first meal of the day, then she eats throughout the evening on her breaks during work Typically eats frozen or prepackaged meals and will snack on crackers DM-glucose levels have been averaging 200-250, typically checks them once per day fasting Has not been taking [place] due to possible diarrhea but has plans to restart now that her diarrhea has improved  She explains she is also under quite a bit of stress and feels tired and sad most days--we discussed anti-depressant therapy but she is not interested   Current Medication: Outpatient Encounter Medications as of 12/18/2020  Medication Sig  . Accu-Chek FastClix Lancets MISC Use as directed to check sugars twice daily. Dx e11.65  . aspirin 81 MG chewable tablet Chew by mouth.  . dapagliflozin propanediol (FARXIGA) 5 MG TABS tablet Take 1 tablet (5 mg total) by mouth daily before breakfast.  . glucose blood (ACCU-CHEK GUIDE) test strip Use as instructed twice a day diag E11.65  . insulin degludec (TRESIBA FLEXTOUCH) 100 UNIT/ML FlexTouch Pen Inject 20 Units into the skin daily.  . Insulin Pen Needle (PEN NEEDLES) 32G X 4 MM MISC 1 each by Does not apply route daily. To use with insulin dosing up to three times daily as needed.  E11.9  . omeprazole (PRILOSEC) 40 MG capsule TAKE 1 TABLET ONCE A DAY 30 MINUTES BEFORE MEAL  .  rosuvastatin (CRESTOR) 10 MG tablet Take 1 tablet (10 mg total) by mouth daily.  . Vitamin D, Ergocalciferol, (DRISDOL) 1.25 MG (50000 UNIT) CAPS capsule TAKE ONE CAPSULE BY MOUTH WEEKLY FOR LOW VITAMIN D  . [DISCONTINUED] metFORMIN (GLUCOPHAGE) 500 MG tablet Take 1 tablet (500 mg total) by mouth 2 (two) times daily with a meal. (Patient not taking: Reported on 12/18/2020)   No facility-administered encounter medications on file as of 12/18/2020.    Surgical History: History reviewed. No pertinent surgical history.  Medical History: Past Medical History:  Diagnosis Date  . Diabetes mellitus without complication (HCC)   . GERD (gastroesophageal reflux disease)   . High cholesterol     Family History: Family History  Problem Relation Age of Onset  . Breast cancer Neg Hx     Social History   Socioeconomic History  . Marital status: Legally Separated    Spouse name: Not on file  . Number of children: Not on file  . Years of education: Not on file  . Highest education level: Not on file  Occupational History  . Not on file  Tobacco Use  . Smoking status: Never Smoker  . Smokeless tobacco: Current User    Types: Snuff  Vaping Use  . Vaping Use: Never used  Substance and Sexual Activity  . Alcohol use: Not Currently  . Drug use: Never  . Sexual activity: Not on file  Other Topics Concern  . Not on file  Social History Narrative  . Not on file   Social Determinants of Health   Financial Resource Strain: Not on file  Food Insecurity: Not on file  Transportation Needs: Not on file  Physical Activity: Not on file  Stress: Not on file  Social Connections: Not on file  Intimate Partner Violence: Not on file      Review of Systems  Constitutional: Positive for fatigue. Negative for chills and diaphoresis.  HENT: Negative for ear pain, postnasal drip and sinus pressure.   Eyes: Negative for photophobia, discharge, redness, itching and visual disturbance.  Respiratory:  Negative for cough, shortness of breath and wheezing.   Cardiovascular: Negative for chest pain, palpitations and leg swelling.  Gastrointestinal: Negative for abdominal pain, constipation, diarrhea, nausea and vomiting.  Genitourinary: Negative for dysuria and flank pain.  Musculoskeletal: Negative for arthralgias, back pain, gait problem and neck pain.  Skin: Negative for color change.  Allergic/Immunologic: Negative for environmental allergies and food allergies.  Neurological: Negative for dizziness and headaches.  Hematological: Does not bruise/bleed easily.  Psychiatric/Behavioral: Positive for behavioral problems (depression) and sleep disturbance. Negative for agitation and hallucinations.    Vital Signs: BP 104/63   Pulse 98   Temp (!) 97.2 F (36.2 C)   Resp 16   Ht 5\' 1"  (1.549 m)   Wt 134 lb 9.6 oz (61.1 kg)   SpO2 99%   BMI 25.43 kg/m    Physical Exam Vitals reviewed.  Constitutional:      Appearance: Normal appearance. She is obese.  Cardiovascular:     Rate and Rhythm: Normal rate and regular rhythm.     Pulses: Normal pulses.     Heart sounds: Normal heart sounds.  Pulmonary:     Effort: Pulmonary effort is normal.     Breath sounds: Normal breath sounds.  Abdominal:     General: Abdomen is flat.     Palpations: Abdomen is soft.  Musculoskeletal:        General: Normal range of motion.     Cervical back: Normal range of motion.  Skin:    General: Skin is warm.  Neurological:     General: No focal deficit present.     Mental Status: She is alert and oriented to person, place, and time. Mental status is at baseline.  Psychiatric:        Mood and Affect: Mood normal.        Behavior: Behavior normal.        Thought Content: Thought content normal.        Judgment: Judgment normal.    Assessment/Plan: 1. Type 2 diabetes mellitus with hyperglycemia, with long-term current use of insulin (HCC) A1C 10.9--uncontrolled Increase dose of Tresiba by 1 unit  each day fasting glucose level above 250--do not exceed 25 units prior to next office visit Restart Farxiga--if diarrhea returns contact office and medication will be adjusted - POCT HgB A1C  2. Mixed hyperlipidemia Continue rosuvastatin--will adjust therapy as needed based on results - Lipid Panel With LDL/HDL Ratio  3. Shift work sleep disorder Strongly advised to adjust schedule in order to get more sleep--uninterrupted sleep  4. High risk medication use - CBC with Differential/Platelet - Comp. Metabolic Panel (12) - Lipid Panel With LDL/HDL Ratio - TSH + free T4  5. Other fatigue - CBC with Differential/Platelet - Comp. Metabolic Panel (12) - TSH + free T4  General Counseling: Sora verbalizes understanding of the findings of todays visit and agrees with plan of treatment.  I have discussed any further diagnostic evaluation that may be needed or ordered today. We also reviewed her medications today. she has been encouraged to call the office with any questions or concerns that should arise related to todays visit.    Orders Placed This Encounter  Procedures  . CBC with Differential/Platelet  . Comp. Metabolic Panel (12)  . Lipid Panel With LDL/HDL Ratio  . TSH + free T4  . POCT HgB A1C   Time spent: 30 Minutes Time spent includes review of chart, medications, test results and follow-up plan with the patient.  This patient was seen by Leeanne Deed AGNP-C in Collaboration with Dr Lyndon Code as a part of collaborative care agreement     Lubertha Basque. Tyrann Donaho AGNP-C Internal medicine

## 2020-12-19 LAB — LIPID PANEL WITH LDL/HDL RATIO
Cholesterol, Total: 161 mg/dL (ref 100–199)
HDL: 58 mg/dL (ref >39)
LDL Chol Calc (NIH): 82 mg/dL (ref 0–99)
LDL/HDL Ratio: 1.4 ratio (ref 0.0–3.2)
Triglycerides: 117 mg/dL (ref 0–149)
VLDL Cholesterol Cal: 21 mg/dL (ref 5–40)

## 2020-12-19 LAB — CBC WITH DIFFERENTIAL/PLATELET
Basophils Absolute: 0 10*3/uL (ref 0.0–0.2)
Basos: 1 %
EOS (ABSOLUTE): 0.1 10*3/uL (ref 0.0–0.4)
Eos: 3 %
Hematocrit: 34 % (ref 34.0–46.6)
Hemoglobin: 11.4 g/dL (ref 11.1–15.9)
Immature Grans (Abs): 0 10*3/uL (ref 0.0–0.1)
Immature Granulocytes: 0 %
Lymphocytes Absolute: 1.3 10*3/uL (ref 0.7–3.1)
Lymphs: 36 %
MCH: 30.6 pg (ref 26.6–33.0)
MCHC: 33.5 g/dL (ref 31.5–35.7)
MCV: 91 fL (ref 79–97)
Monocytes Absolute: 0.3 10*3/uL (ref 0.1–0.9)
Monocytes: 7 %
Neutrophils Absolute: 2 10*3/uL (ref 1.4–7.0)
Neutrophils: 53 %
Platelets: 183 10*3/uL (ref 150–450)
RBC: 3.72 x10E6/uL — ABNORMAL LOW (ref 3.77–5.28)
RDW: 11.3 % — ABNORMAL LOW (ref 11.7–15.4)
WBC: 3.8 10*3/uL (ref 3.4–10.8)

## 2020-12-19 LAB — COMP. METABOLIC PANEL (12)
AST: 24 IU/L (ref 0–40)
Albumin/Globulin Ratio: 1.9 (ref 1.2–2.2)
Albumin: 4.1 g/dL (ref 3.8–4.8)
Alkaline Phosphatase: 146 IU/L — ABNORMAL HIGH (ref 44–121)
BUN/Creatinine Ratio: 16 (ref 12–28)
BUN: 12 mg/dL (ref 8–27)
Bilirubin Total: 1.1 mg/dL (ref 0.0–1.2)
Calcium: 9.3 mg/dL (ref 8.7–10.3)
Chloride: 100 mmol/L (ref 96–106)
Creatinine, Ser: 0.76 mg/dL (ref 0.57–1.00)
GFR calc Af Amer: 98 mL/min/{1.73_m2} (ref >59)
GFR calc non Af Amer: 85 mL/min/{1.73_m2} (ref >59)
Globulin, Total: 2.2 g/dL (ref 1.5–4.5)
Glucose: 400 mg/dL — ABNORMAL HIGH (ref 65–99)
Potassium: 4 mmol/L (ref 3.5–5.2)
Sodium: 135 mmol/L (ref 134–144)
Total Protein: 6.3 g/dL (ref 6.0–8.5)

## 2020-12-19 LAB — TSH+FREE T4
Free T4: 1.17 ng/dL (ref 0.82–1.77)
TSH: 1.54 u[IU]/mL (ref 0.450–4.500)

## 2020-12-22 ENCOUNTER — Encounter: Payer: Self-pay | Admitting: Hospice and Palliative Medicine

## 2021-01-06 ENCOUNTER — Other Ambulatory Visit: Payer: Self-pay | Admitting: Hospice and Palliative Medicine

## 2021-01-06 DIAGNOSIS — R159 Full incontinence of feces: Secondary | ICD-10-CM

## 2021-01-06 NOTE — Progress Notes (Signed)
Patient called c/o bowel incontinence for the last several weeks, worsening episodes over the last few days.

## 2021-01-13 ENCOUNTER — Other Ambulatory Visit: Payer: Self-pay | Admitting: Hospice and Palliative Medicine

## 2021-01-19 ENCOUNTER — Encounter: Payer: Self-pay | Admitting: *Deleted

## 2021-01-20 ENCOUNTER — Encounter: Payer: Self-pay | Admitting: *Deleted

## 2021-02-18 ENCOUNTER — Encounter: Payer: Self-pay | Admitting: Gastroenterology

## 2021-02-18 ENCOUNTER — Encounter (INDEPENDENT_AMBULATORY_CARE_PROVIDER_SITE_OTHER): Payer: Self-pay

## 2021-02-18 ENCOUNTER — Other Ambulatory Visit: Payer: Self-pay

## 2021-02-18 ENCOUNTER — Ambulatory Visit (INDEPENDENT_AMBULATORY_CARE_PROVIDER_SITE_OTHER): Payer: PRIVATE HEALTH INSURANCE | Admitting: Gastroenterology

## 2021-02-18 VITALS — BP 106/67 | HR 83 | Temp 97.6°F | Ht 61.0 in | Wt 130.8 lb

## 2021-02-18 DIAGNOSIS — R159 Full incontinence of feces: Secondary | ICD-10-CM

## 2021-02-18 NOTE — Progress Notes (Signed)
Gastroenterology Consultation  Referring Provider:     Theotis Burrow, NP Primary Care Physician:  Theotis Burrow, NP Primary Gastroenterologist:  Dr. Servando Snare     Reason for Consultation:     Stool incontinence and diarrhea        HPI:   Danyla Wattley is a 60 y.o. y/o female referred for consultation & management of Stool incontinence and diarrhea by Dr. Tiburcio Pea, Lubertha Basque, NP.  This patient comes in today after reporting to her primary care provider back  On February 22 that the patient had been having a few weeks of diarrhea with worsening over the previous few days with stool incontinence.  The patient wasn't sure if it was from the medication that she had started which was [place] or was something else.  The patient suffers from diabetes. She has lost 50 lbs in 2 years but it happened over a few months back then.  The patient denies any black stools or bloody stools.  She also denies any fevers chills nausea or vomiting.  She does have milk products and cheese in her diet.  She knows that these can cause her to have more gas but she did not see a correlation between the diarrhea.  She reports that she has the diarrhea associate with a stool incontinence and usually has that anywhere from 2-3 times a week to none at all.  Past Medical History:  Diagnosis Date  . Diabetes mellitus without complication (HCC)   . GERD (gastroesophageal reflux disease)   . High cholesterol     No past surgical history on file.  Prior to Admission medications   Medication Sig Start Date End Date Taking? Authorizing Provider  Accu-Chek FastClix Lancets MISC Use as directed to check sugars twice daily. Dx e11.65 03/21/20   Carlean Jews, NP  aspirin 81 MG chewable tablet Chew by mouth.    [provider]  dapagliflozin propanediol (FARXIGA) 5 MG TABS tablet Take 1 tablet (5 mg total) by mouth daily before breakfast. 12/02/20   Lyndon Code, MD  glucose blood (ACCU-CHEK GUIDE) test strip  Use as instructed twice a day diag E11.65 03/05/20   Carlean Jews, NP  insulin degludec (TRESIBA FLEXTOUCH) 100 UNIT/ML FlexTouch Pen Inject 20 Units into the skin daily. 08/20/20   Carlean Jews, NP  Insulin Pen Needle (PEN NEEDLES) 32G X 4 MM MISC 1 each by Does not apply route daily. To use with insulin dosing up to three times daily as needed.  E11.9 11/28/19   Carlean Jews, NP  omeprazole (PRILOSEC) 40 MG capsule TAKE 1 TABLET ONCE A DAY 30 MINUTES BEFORE MEAL 09/16/20   Carlean Jews, NP  rosuvastatin (CRESTOR) 10 MG tablet Take 1 tablet (10 mg total) by mouth daily. 11/26/20   Theotis Burrow, NP  Vitamin D, Ergocalciferol, (DRISDOL) 1.25 MG (50000 UNIT) CAPS capsule TAKE ONE CAPSULE BY MOUTH WEEKLY FOR LOW VITAMIN D 11/21/20   Theotis Burrow, NP    Family History  Problem Relation Age of Onset  . Breast cancer Neg Hx      Social History   Tobacco Use  . Smoking status: Never Smoker  . Smokeless tobacco: Current User    Types: Snuff  Vaping Use  . Vaping Use: Never used  Substance Use Topics  . Alcohol use: Not Currently  . Drug use: Never    Allergies as of 02/18/2021  . (No Known Allergies)  Review of Systems:    All systems reviewed and negative except where noted in HPI.   Physical Exam:  There were no vitals taken for this visit. No LMP recorded. Patient is postmenopausal. General:   Alert,  Well-developed, well-nourished, pleasant and cooperative in NAD Head:  Normocephalic and atraumatic. Eyes:  Sclera clear, no icterus.   Conjunctiva pink. Ears:  Normal auditory acuity. Neck:  Supple; no masses or thyromegaly. Lungs:  Respirations even and unlabored.  Clear throughout to auscultation.   No wheezes, crackles, or rhonchi. No acute distress. Heart:  Regular rate and rhythm; no murmurs, clicks, rubs, or gallops. Abdomen:  Normal bowel sounds.  No bruits.  Soft, non-tender and non-distended without masses, hepatosplenomegaly or hernias noted.  No  guarding or rebound tenderness.  Negative Carnett sign.   Rectal:  Deferred.  Pulses:  Normal pulses noted. Extremities:  No clubbing or edema.  No cyanosis. Neurologic:  Alert and oriented x3;  grossly normal neurologically. Skin:  Intact without significant lesions or rashes.  No jaundice. Lymph Nodes:  No significant cervical adenopathy. Psych:  Alert and cooperative. Normal mood and affect.  Imaging Studies: No results found.  Assessment and Plan:   Shaeleigh Graw is a 59 y.o. y/o female who comes in today with a history of intermittent diarrhea.  The patient has been told to avoid dairy products for 2 weeks to see if her diarrhea completely resolves.  If it does the patient has been told that she may want to get Lactaid milk and Lactaid pills so that when she has milk or eats dairy products she can supplement the enzyme.  She has also been told that if it does not improve her diarrhea or incontinence then she should contact my office whereupon she will be set up for colonoscopy to rule out any colon pathology as the cause of her symptoms.  The patient has been told that it is more likely to be something she is eating because it happens intermittently and other pathology such as colitis would not be so variable.  The patient has been explained the plan agrees with it.    Midge Minium, MD. Clementeen Graham    Note: This dictation was prepared with Dragon dictation along with smaller phrase technology. Any transcriptional errors that result from this process are unintentional.

## 2021-03-17 ENCOUNTER — Ambulatory Visit: Payer: PRIVATE HEALTH INSURANCE | Admitting: Hospice and Palliative Medicine

## 2021-04-09 ENCOUNTER — Ambulatory Visit
Admission: EM | Admit: 2021-04-09 | Discharge: 2021-04-09 | Disposition: A | Discharge status: Home / Self Care | Payer: PRIVATE HEALTH INSURANCE | Attending: Family Medicine | Admitting: Family Medicine

## 2021-04-09 ENCOUNTER — Other Ambulatory Visit: Payer: Self-pay

## 2021-04-09 ENCOUNTER — Encounter: Payer: Self-pay | Admitting: Emergency Medicine

## 2021-04-09 DIAGNOSIS — U071 COVID-19: Secondary | ICD-10-CM | POA: Diagnosis not present

## 2021-04-09 DIAGNOSIS — F17290 Nicotine dependence, other tobacco product, uncomplicated: Secondary | ICD-10-CM | POA: Diagnosis not present

## 2021-04-09 DIAGNOSIS — Z79899 Other long term (current) drug therapy: Secondary | ICD-10-CM | POA: Diagnosis not present

## 2021-04-09 DIAGNOSIS — Z7982 Long term (current) use of aspirin: Secondary | ICD-10-CM | POA: Insufficient documentation

## 2021-04-09 DIAGNOSIS — R059 Cough, unspecified: Secondary | ICD-10-CM | POA: Diagnosis present

## 2021-04-09 LAB — RESP PANEL BY RT-PCR (FLU A&B, COVID) ARPGX2
Influenza A by PCR: NEGATIVE
Influenza B by PCR: NEGATIVE
SARS Coronavirus 2 by RT PCR: POSITIVE — AB

## 2021-04-09 MED ORDER — ONDANSETRON 8 MG PO TBDP: 8.0000 mg | ORAL_TABLET | Freq: Three times a day (TID) | ORAL | 0 refills | Status: DC | PRN

## 2021-04-09 MED ORDER — NIRMATRELVIR/RITONAVIR (PAXLOVID)TABLET: 3.0000 | ORAL_TABLET | Freq: Two times a day (BID) | ORAL | 0 refills | Status: AC

## 2021-04-09 NOTE — Discharge Instructions (Addendum)
You have tested positive for COVID today and you will need to quarantine for 5 days from this point going forward.  After 5 days you can break quarantine if your symptoms have improved and you have not had a fever for 24 hours without taking Tylenol and ibuprofen.  Use over-the-counter Tylenol and ibuprofen as needed for fever and body aches.  Take the Zofran every 8 hours as needed for nausea and vomiting.  Take the Paxlovid as directed for treatment of the COVID-19 virus.  If you develop shortness of breath, especially at rest, you are unable to catch her breath, you cannot speak in full sentences, or your lip start turning blue you need to go to the ER for evaluation.  Similarly, if the Zofran does not help your nausea and vomiting, and you are unable to keep down fluids, you need to go to the ER for IV hydration.

## 2021-04-09 NOTE — ED Provider Notes (Signed)
[facility]    CSN: [id number] Arrival date & time: 04/09/21  0818      History   Chief Complaint Chief Complaint  Patient presents with  . Cough  . Emesis    HPI Kristin Cruz is a 60 y.o. female.   HPI   60 year old female here for evaluation of cold complaints.  Patient reports that last night while she was at work she developed body aches, subjective fever, fatigue, runny nose with clear nasal discharge, sore throat, nonproductive cough, and nausea, vomiting, and diarrhea.  She denies ear pain or pressure, shortness breath or wheezing, abdominal pain, or other sick contacts.  She has not had her flu shot but she has had her COVID shot but has not been boosted.  Past Medical History:  Diagnosis Date  . Diabetes mellitus without complication (HCC)   . GERD (gastroesophageal reflux disease)   . High cholesterol     Patient Active Problem List   Diagnosis Date Noted  . Hypertrophic toenail 07/15/2020  . Nonintractable headache 02/18/2020  . Encounter for general adult medical examination with abnormal findings 11/24/2019  . Routine cervical smear 11/24/2019  . Dysuria 11/24/2019  . Pain of left shoulder region 08/26/2019  . Type 2 diabetes mellitus with hyperglycemia (HCC) 07/18/2019  . Screening for breast cancer 07/18/2019  . Gastroesophageal reflux disease without esophagitis 07/18/2019  . Mixed hyperlipidemia 07/18/2019  . Hyperlipidemia due to type 2 diabetes mellitus (HCC) 12/29/2017  . Type 2 diabetes mellitus with diabetic polyneuropathy, with long-term current use of insulin (HCC) 12/29/2017  . Vitamin D deficiency 12/29/2017    History reviewed. No pertinent surgical history.  OB History   No obstetric history on file.      Home Medications    Prior to Admission medications   Medication Sig Start Date End Date Taking? Authorizing Provider  aspirin 81 MG chewable tablet Chew by mouth.   Yes [provider]  insulin  degludec (TRESIBA FLEXTOUCH) 100 UNIT/ML FlexTouch Pen Inject 20 Units into the skin daily. 08/20/20  Yes Boscia, Kathlynn Grate, NP  nirmatrelvir/ritonavir EUA (PAXLOVID) TABS Take 3 tablets by mouth 2 (two) times daily for 5 days. Patient GFR is 98. Take nirmatrelvir (150 mg) two tablets twice daily for 5 days and ritonavir (100 mg) one tablet twice daily for 5 days. 04/09/21 04/14/21 Yes Becky Augusta, NP  ondansetron (ZOFRAN ODT) 8 MG disintegrating tablet Take 1 tablet (8 mg total) by mouth every 8 (eight) hours as needed for nausea or vomiting. 04/09/21  Yes Becky Augusta, NP  rosuvastatin (CRESTOR) 10 MG tablet Take 1 tablet (10 mg total) by mouth daily. 11/26/20  Yes Theotis Burrow, NP  Vitamin D, Ergocalciferol, (DRISDOL) 1.25 MG (50000 UNIT) CAPS capsule TAKE ONE CAPSULE BY MOUTH WEEKLY FOR LOW VITAMIN D 11/21/20  Yes Theotis Burrow, NP  Accu-Chek FastClix Lancets MISC Use as directed to check sugars twice daily. Dx e11.65 03/21/20   Carlean Jews, NP  glucose blood (ACCU-CHEK GUIDE) test strip Use as instructed twice a day diag E11.65 03/05/20   Carlean Jews, NP  Insulin Pen Needle (PEN NEEDLES) 32G X 4 MM MISC 1 each by Does not apply route daily. To use with insulin dosing up to three times daily as needed.  E11.9 11/28/19   Carlean Jews, NP  omeprazole (PRILOSEC) 40 MG capsule TAKE 1 TABLET ONCE A DAY 30 MINUTES BEFORE MEAL 09/16/20 04/09/21  Carlean Jews, NP    Family  History Family History  Problem Relation Age of Onset  . Breast cancer Neg Hx     Social History Social History   Tobacco Use  . Smoking status: Never Smoker  . Smokeless tobacco: Current User    Types: Snuff  Vaping Use  . Vaping Use: Never used  Substance Use Topics  . Alcohol use: Not Currently  . Drug use: Never     Allergies   Patient has no known allergies.   Review of Systems Review of Systems  Constitutional: Positive for fatigue and fever. Negative for activity change and appetite change.   HENT: Positive for congestion, rhinorrhea and sore throat. Negative for ear pain.   Respiratory: Positive for cough. Negative for shortness of breath and wheezing.   Gastrointestinal: Positive for diarrhea, nausea and vomiting. Negative for abdominal pain.  Musculoskeletal: Positive for arthralgias and myalgias.  Skin: Negative for rash.  Hematological: Negative.   Psychiatric/Behavioral: Negative.      Physical Exam Triage Vital Signs ED Triage Vitals  Enc Vitals Group     BP 04/09/21 0849 100/60     Pulse Rate 04/09/21 0849 (!) 112     Resp 04/09/21 0849 18     Temp 04/09/21 0849 98.7 F (37.1 C)     Temp Source 04/09/21 0849 Oral     SpO2 04/09/21 0849 99 %     Weight 04/09/21 0847 130 lb 11.7 oz (59.3 kg)     Height 04/09/21 0847 5\' 1"  (1.549 m)     Head Circumference --      Peak Flow --      Pain Score 04/09/21 0847 6     Pain Loc --      Pain Edu? --      Excl. in GC? --    No data found.  Updated Vital Signs BP 100/60 (BP Location: Left Arm)   Pulse (!) 112   Temp 98.7 F (37.1 C) (Oral)   Resp 18   Ht 5\' 1"  (1.549 m)   Wt 130 lb 11.7 oz (59.3 kg)   SpO2 99%   BMI 24.70 kg/m   Visual Acuity Right Eye Distance:   Left Eye Distance:   Bilateral Distance:    Right Eye Near:   Left Eye Near:    Bilateral Near:     Physical Exam Vitals and nursing note reviewed.  Constitutional:      General: She is not in acute distress.    Appearance: Normal appearance. She is ill-appearing.  HENT:     Head: Normocephalic and atraumatic.     Right Ear: Tympanic membrane, ear canal and external ear normal.     Left Ear: Tympanic membrane, ear canal and external ear normal.     Nose: Congestion present. No rhinorrhea.     Mouth/Throat:     Mouth: Mucous membranes are moist.     Pharynx: Oropharynx is clear.  Cardiovascular:     Rate and Rhythm: Normal rate and regular rhythm.     Pulses: Normal pulses.     Heart sounds: Normal heart sounds. No murmur  heard. No gallop.   Pulmonary:     Effort: Pulmonary effort is normal.     Breath sounds: Normal breath sounds. No wheezing, rhonchi or rales.  Musculoskeletal:     Cervical back: Normal range of motion and neck supple.  Lymphadenopathy:     Cervical: No cervical adenopathy.  Skin:    General: Skin is warm and dry.  Capillary Refill: Capillary refill takes less than 2 seconds.     Findings: No erythema or rash.  Neurological:     General: No focal deficit present.     Mental Status: She is alert and oriented to person, place, and time.  Psychiatric:        Mood and Affect: Mood normal.        Behavior: Behavior normal.        Thought Content: Thought content normal.        Judgment: Judgment normal.      UC Treatments / Results  Labs (all labs ordered are listed, but only abnormal results are displayed) Labs Reviewed  RESP PANEL BY RT-PCR (FLU A&B, COVID) ARPGX2 - Abnormal; Notable for the following components:      Result Value   SARS Coronavirus 2 by RT PCR POSITIVE (*)    All other components within normal limits    EKG   Radiology No results found.  Procedures Procedures (including critical care time)  Medications Ordered in UC Medications - No data to display  Initial Impression / Assessment and Plan / UC Course  I have reviewed the triage vital signs and the nursing notes.  Pertinent labs & imaging results that were available during my care of the patient were reviewed by me and considered in my medical decision making (see chart for details).   Patient is a very pleasant but ill-appearing 60 year old female here for evaluation of flu/COVID-like illness that started last night while she was at work.  She had a subjective fever, body aches, fatigue, nausea vomiting diarrhea, nonproductive cough, runny nose with clear nasal discharge, and a sore throat.  Physical exam reveals pearly gray tympanic membranes bilaterally with a normal light reflex and clear  external auditory canals.  Nasal mucosa is erythematous and mildly edematous without significant discharge.  Posterior oropharynx is erythematous with clear postnasal drip.  No cervical lymphadenopathy appreciated exam.  Cardiopulmonary exam is benign.  Will swab patient for COVID and flu.  Patient is COVID-positive.  Will treat patient with Paxlovid and Zofran.  Patient had chemistry panel in February which listed her GFR at 62.  Provided.   Final Clinical Impressions(s) / UC Diagnoses   Final diagnoses:  COVID-19     Discharge Instructions     You have tested positive for COVID today and you will need to quarantine for 5 days from this point going forward.  After 5 days you can break quarantine if your symptoms have improved and you have not had a fever for 24 hours without taking Tylenol and ibuprofen.  Use over-the-counter Tylenol and ibuprofen as needed for fever and body aches.  Take the Zofran every 8 hours as needed for nausea and vomiting.  Take the Paxlovid as directed for treatment of the COVID-19 virus.  If you develop shortness of breath, especially at rest, you are unable to catch her breath, you cannot speak in full sentences, or your lip start turning blue you need to go to the ER for evaluation.  Similarly, if the Zofran does not help your nausea and vomiting, and you are unable to keep down fluids, you need to go to the ER for IV hydration.    ED Prescriptions    Medication Sig Dispense Auth. Provider   ondansetron (ZOFRAN ODT) 8 MG disintegrating tablet Take 1 tablet (8 mg total) by mouth every 8 (eight) hours as needed for nausea or vomiting. 20 tablet Becky Augusta, NP   nirmatrelvir/ritonavir  EUA (PAXLOVID) TABS Take 3 tablets by mouth 2 (two) times daily for 5 days. Patient GFR is 98. Take nirmatrelvir (150 mg) two tablets twice daily for 5 days and ritonavir (100 mg) one tablet twice daily for 5 days. 30 tablet Becky Augustayan, Marlisa Caridi, NP     PDMP not reviewed this  encounter.   Becky Augustayan, Tyah Acord, NP 04/09/21 [phone]

## 2021-04-09 NOTE — ED Triage Notes (Signed)
Pt c/o cough, vomiting, diarrhea, fatigue, subjective fever, body aches. Started last night while she was at work.

## 2021-04-30 ENCOUNTER — Encounter: Payer: Self-pay | Admitting: Physician Assistant

## 2021-04-30 ENCOUNTER — Other Ambulatory Visit: Payer: Self-pay

## 2021-04-30 ENCOUNTER — Ambulatory Visit: Payer: No Typology Code available for payment source | Admitting: Physician Assistant

## 2021-04-30 DIAGNOSIS — E1165 Type 2 diabetes mellitus with hyperglycemia: Secondary | ICD-10-CM | POA: Diagnosis not present

## 2021-04-30 DIAGNOSIS — K219 Gastro-esophageal reflux disease without esophagitis: Secondary | ICD-10-CM

## 2021-04-30 DIAGNOSIS — E782 Mixed hyperlipidemia: Secondary | ICD-10-CM | POA: Diagnosis not present

## 2021-04-30 DIAGNOSIS — G4726 Circadian rhythm sleep disorder, shift work type: Secondary | ICD-10-CM | POA: Diagnosis not present

## 2021-04-30 DIAGNOSIS — E559 Vitamin D deficiency, unspecified: Secondary | ICD-10-CM

## 2021-04-30 DIAGNOSIS — Z794 Long term (current) use of insulin: Secondary | ICD-10-CM

## 2021-04-30 DIAGNOSIS — B379 Candidiasis, unspecified: Secondary | ICD-10-CM

## 2021-04-30 LAB — POCT GLYCOSYLATED HEMOGLOBIN (HGB A1C): Hemoglobin A1C: 11.8 % — AB (ref 4.0–5.6)

## 2021-04-30 MED ORDER — FLUCONAZOLE 150 MG PO TABS: 150.0000 mg | ORAL_TABLET | Freq: Once | ORAL | 0 refills | Status: AC

## 2021-04-30 MED ORDER — DAPAGLIFLOZIN PROPANEDIOL 10 MG PO TABS: 10.0000 mg | ORAL_TABLET | Freq: Every day | ORAL | 2 refills | Status: DC

## 2021-04-30 MED ORDER — VITAMIN D (ERGOCALCIFEROL) 1.25 MG (50000 UNIT) PO CAPS: ORAL_CAPSULE | ORAL | 3 refills | Status: DC

## 2021-04-30 MED ORDER — OMEPRAZOLE 40 MG PO CPDR: DELAYED_RELEASE_CAPSULE | ORAL | 3 refills | Status: DC

## 2021-04-30 NOTE — Progress Notes (Signed)
[facility] [address] [place], [place] [address]  Internal MEDICINE  Office Visit Note  Patient Name: Kristin Cruz  [id number]  [id number]  Date of Service: 05/03/2021  Chief Complaint  Patient presents with   Follow-up    Discuss refills   Diabetes   Hyperlipidemia   Quality Metric Gaps    Pneumovax, shingrix, eye exam, foot exam    HPI Pt is here for routine follow up. -Fasting BG 120-140, but then will be occasionally higher. -Not taking farxiga, taking jardiance instead due to diarrhea, but has been out of this since just samples of this so really has not been on any oral med recently. -Discovered that diarrhea might be from dairy products instead of medication. Was seen by GI for this. Upon stopping cheese/milk the diarrhea stopped. 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 Educated to take at the same time. will increase by 1 unit when over 200, not to exceed 25units -Will restart Farxiga and Increase to 10mg . -Educated on need for better diet control, avoiding sweets and sugars sodas/drinks -Pt reports feeling depressed due to worrying over children. Denies any SI/HI. Denies wanting to start any medication or counseling, but will call office if this changes  Current Medication: Outpatient Encounter Medications as of 04/30/2021  Medication Sig   Accu-Chek FastClix Lancets MISC Use as directed to check sugars twice daily. Dx e11.65   aspirin 81 MG chewable tablet Chew by mouth.   dapagliflozin propanediol (FARXIGA) 10 MG TABS tablet Take 1 tablet (10 mg total) by mouth daily before breakfast.   [EXPIRED] fluconazole (DIFLUCAN) 150 MG tablet Take 1 tablet (150 mg total) by mouth once for 1 dose.   glucose blood (ACCU-CHEK GUIDE) test strip Use as instructed twice a day diag E11.65   insulin degludec (TRESIBA FLEXTOUCH) 100 UNIT/ML FlexTouch Pen Inject 20  Units into the skin daily.   Insulin Pen Needle (PEN NEEDLES) 32G X 4 MM MISC 1 each by Does not apply route daily. To use with insulin dosing up to three times daily as needed.  E11.9   rosuvastatin (CRESTOR) 10 MG tablet Take 1 tablet (10 mg total) by mouth daily.   [DISCONTINUED] Vitamin D, Ergocalciferol, (DRISDOL) 1.25 MG (50000 UNIT) CAPS capsule TAKE ONE CAPSULE BY MOUTH WEEKLY FOR LOW VITAMIN D   omeprazole (PRILOSEC) 40 MG capsule TAKE 1 TABLET ONCE A DAY 30 MINUTES BEFORE MEAL   Vitamin D, Ergocalciferol, (DRISDOL) 1.25 MG (50000 UNIT) CAPS capsule TAKE ONE CAPSULE BY MOUTH WEEKLY FOR LOW VITAMIN D   [DISCONTINUED] omeprazole (PRILOSEC) 40 MG capsule TAKE 1 TABLET ONCE A DAY 30 MINUTES BEFORE MEAL   [DISCONTINUED] ondansetron (ZOFRAN ODT) 8 MG disintegrating tablet Take 1 tablet (8 mg total) by mouth every 8 (eight) hours as needed for nausea or vomiting. (Patient not taking: Reported on 04/30/2021)   No facility-administered encounter medications on file as of 04/30/2021.    Surgical History: History reviewed. No pertinent surgical history.  Medical History: Past Medical History:  Diagnosis Date   Diabetes mellitus without complication (HCC)    GERD (gastroesophageal reflux disease)    High cholesterol     Family History: Family History  Problem Relation Age of Onset   Diabetes Mother    Alcohol abuse Son    Cancer Maternal Aunt    Breast cancer Neg Hx     Social History  Socioeconomic History   Marital status: Legally Separated    Spouse name: Not on file   Number of children: Not on file   Years of education: Not on file   Highest education level: Not on file  Occupational History   Not on file  Tobacco Use   Smoking status: Never   Smokeless tobacco: Current    Types: Snuff  Vaping Use   Vaping Use: Never used  Substance and Sexual Activity   Alcohol use: Not Currently   Drug use: Never   Sexual activity: Not on file  Other Topics Concern   Not on  file  Social History Narrative   Not on file   Social Determinants of Health   Financial Resource Strain: Not on file  Food Insecurity: Not on file  Transportation Needs: Not on file  Physical Activity: Not on file  Stress: Not on file  Social Connections: Not on file  Intimate Partner Violence: Not on file      Review of Systems  Constitutional:  Positive for fatigue. Negative for chills and unexpected weight change.  HENT:  Negative for congestion, postnasal drip, rhinorrhea, sneezing and sore throat.   Eyes:  Negative for redness.  Respiratory:  Negative for cough, chest tightness and shortness of breath.   Cardiovascular:  Negative for chest pain and palpitations.  Gastrointestinal:  Negative for abdominal pain, constipation, diarrhea, nausea and vomiting.  Genitourinary:  Negative for dysuria and frequency.  Musculoskeletal:  Negative for arthralgias, back pain, joint swelling and neck pain.  Skin:  Negative for rash.  Neurological: Negative.  Negative for tremors and numbness.  Hematological:  Negative for adenopathy. Does not bruise/bleed easily.  Psychiatric/Behavioral:  Positive for behavioral problems (Depression) and sleep disturbance. Negative for suicidal ideas. The patient is not nervous/anxious.    Vital Signs: BP 100/70   Pulse 82   Temp (!) 97.2 F (36.2 C)   Resp 16   Ht 5' 1.25" (1.556 m)   Wt 124 lb 6.4 oz (56.4 kg)   SpO2 98%   BMI 23.31 kg/m    Physical Exam Vitals and nursing note reviewed.  Constitutional:      General: She is not in acute distress.    Appearance: She is well-developed and normal weight. She is not diaphoretic.  HENT:     Head: Normocephalic and atraumatic.     Mouth/Throat:     Pharynx: No oropharyngeal exudate.  Eyes:     Pupils: Pupils are equal, round, and reactive to light.  Neck:     Thyroid: No thyromegaly.     Vascular: No JVD.     Trachea: No tracheal deviation.  Cardiovascular:     Rate and Rhythm: Normal  rate and regular rhythm.     Heart sounds: Normal heart sounds. No murmur heard.   No friction rub. No gallop.  Pulmonary:     Effort: Pulmonary effort is normal. No respiratory distress.     Breath sounds: No wheezing or rales.  Chest:     Chest wall: No tenderness.  Abdominal:     General: Bowel sounds are normal.     Palpations: Abdomen is soft.  Musculoskeletal:        General: Normal range of motion.     Cervical back: Normal range of motion and neck supple.  Lymphadenopathy:     Cervical: No cervical adenopathy.  Skin:    General: Skin is warm and dry.  Neurological:     Mental Status: She  is alert and oriented to person, place, and time.     Cranial Nerves: No cranial nerve deficit.  Psychiatric:        Behavior: Behavior normal.        Thought Content: Thought content normal.        Judgment: Judgment normal.       Assessment/Plan: 1. Type 2 diabetes mellitus with hyperglycemia, with long-term current use of insulin (HCC) - POCT HgB A1C is 11.8, pt has been without oral medication for awhile and admits to poor diet. Will restart Farxiga at 10mg  daily and will increase insulin by 1 unit each day that fasting sugar >200, max 25 units. Pt also educated to take insulin at the same time each day and to improve diet. Urged that numbers must be improving by next visit otherwise may need endocrine/insulin pump. Discussed the importance of avoiding sugary food/drinks. - dapagliflozin propanediol (FARXIGA) 10 MG TABS tablet; Take 1 tablet (10 mg total) by mouth daily before breakfast.  Dispense: 30 tablet; Refill: 2  2. Shift work sleep disorder Pt works nights and discussed the need for more sleep time, at least 7 hours.  3. Mixed hyperlipidemia Continue crestor  4. Gastroesophageal reflux disease without esophagitis - omeprazole (PRILOSEC) 40 MG capsule; TAKE 1 TABLET ONCE A DAY 30 MINUTES BEFORE MEAL  Dispense: 30 capsule; Refill: 3  5. Vitamin D deficiency - Vitamin D,  Ergocalciferol, (DRISDOL) 1.25 MG (50000 UNIT) CAPS capsule; TAKE ONE CAPSULE BY MOUTH WEEKLY FOR LOW VITAMIN D  Dispense: 4 capsule; Refill: 3  6. Yeast infection - fluconazole (DIFLUCAN) 150 MG tablet; Take 1 tablet (150 mg total) by mouth once for 1 dose.  Dispense: 3 tablet; Refill: 0   General Counseling: Nakyra verbalizes understanding of the findings of todays visit and agrees with plan of treatment. I have discussed any further diagnostic evaluation that may be needed or ordered today. We also reviewed her medications today. she has been encouraged to call the office with any questions or concerns that should arise related to todays visit.    Orders Placed This Encounter  Procedures   POCT HgB A1C    Meds ordered this encounter  Medications   dapagliflozin propanediol (FARXIGA) 10 MG TABS tablet    Sig: Take 1 tablet (10 mg total) by mouth daily before breakfast.    Dispense:  30 tablet    Refill:  2   Vitamin D, Ergocalciferol, (DRISDOL) 1.25 MG (50000 UNIT) CAPS capsule    Sig: TAKE ONE CAPSULE BY MOUTH WEEKLY FOR LOW VITAMIN D    Dispense:  4 capsule    Refill:  3   omeprazole (PRILOSEC) 40 MG capsule    Sig: TAKE 1 TABLET ONCE A DAY 30 MINUTES BEFORE MEAL    Dispense:  30 capsule    Refill:  3   fluconazole (DIFLUCAN) 150 MG tablet    Sig: Take 1 tablet (150 mg total) by mouth once for 1 dose.    Dispense:  3 tablet    Refill:  0     This patient was seen by , PA-C in collaboration with Dr. Lynn Ito as a part of collaborative care agreement.   Total time spent:30 Minutes Time spent includes review of chart, medications, test results, and follow up plan with the patient.      Dr Beverely Risen Internal medicine

## 2021-05-08 ENCOUNTER — Telehealth: Payer: Self-pay

## 2021-05-08 NOTE — Telephone Encounter (Signed)
I spoke with patient to see how her sugar levels were doing. She stated it has been between 120-140. 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 It was 140 yesterday. I notified Dr. Jeralene Huff

## 2021-05-11 ENCOUNTER — Other Ambulatory Visit: Payer: Self-pay | Admitting: Physician Assistant

## 2021-05-11 DIAGNOSIS — Z1231 Encounter for screening mammogram for malignant neoplasm of breast: Secondary | ICD-10-CM

## 2021-05-26 ENCOUNTER — Other Ambulatory Visit: Payer: Self-pay

## 2021-05-26 ENCOUNTER — Ambulatory Visit
Admission: RE | Admit: 2021-05-26 | Discharge: 2021-05-26 | Disposition: A | Discharge status: Home / Self Care | Payer: PRIVATE HEALTH INSURANCE | Source: Ambulatory Visit | Attending: Physician Assistant | Admitting: Physician Assistant

## 2021-05-26 DIAGNOSIS — Z1231 Encounter for screening mammogram for malignant neoplasm of breast: Secondary | ICD-10-CM | POA: Insufficient documentation

## 2021-06-09 ENCOUNTER — Telehealth: Payer: Self-pay

## 2021-06-09 NOTE — Telephone Encounter (Signed)
Came today and pickup tresiba samples pt said her daughter talk to courtney about that she coming to pickup samples

## 2021-07-31 ENCOUNTER — Encounter: Payer: Self-pay | Admitting: Physician Assistant

## 2021-07-31 ENCOUNTER — Other Ambulatory Visit: Payer: Self-pay

## 2021-07-31 ENCOUNTER — Ambulatory Visit (INDEPENDENT_AMBULATORY_CARE_PROVIDER_SITE_OTHER): Payer: No Typology Code available for payment source | Admitting: Physician Assistant

## 2021-07-31 VITALS — BP 97/69 | HR 83 | Temp 98.3°F | Resp 16 | Ht 61.75 in | Wt 122.4 lb

## 2021-07-31 DIAGNOSIS — G4726 Circadian rhythm sleep disorder, shift work type: Secondary | ICD-10-CM | POA: Diagnosis not present

## 2021-07-31 DIAGNOSIS — E1165 Type 2 diabetes mellitus with hyperglycemia: Secondary | ICD-10-CM

## 2021-07-31 DIAGNOSIS — E782 Mixed hyperlipidemia: Secondary | ICD-10-CM | POA: Diagnosis not present

## 2021-07-31 DIAGNOSIS — Z794 Long term (current) use of insulin: Secondary | ICD-10-CM | POA: Diagnosis not present

## 2021-07-31 LAB — POCT GLYCOSYLATED HEMOGLOBIN (HGB A1C): Hemoglobin A1C: 10.9 % — AB (ref 4.0–5.6)

## 2021-07-31 MED ORDER — ROSUVASTATIN CALCIUM 10 MG PO TABS: 10.0000 mg | ORAL_TABLET | Freq: Every day | ORAL | 3 refills | Status: DC

## 2021-07-31 MED ORDER — TRESIBA FLEXTOUCH 100 UNIT/ML ~~LOC~~ SOPN: 20.0000 [IU] | PEN_INJECTOR | Freq: Every day | SUBCUTANEOUS | 1 refills | Status: DC

## 2021-07-31 MED ORDER — GLIMEPIRIDE 1 MG PO TABS: 1.0000 mg | ORAL_TABLET | Freq: Every day | ORAL | 2 refills | Status: DC

## 2021-07-31 MED ORDER — DAPAGLIFLOZIN PROPANEDIOL 10 MG PO TABS
10.0000 mg | ORAL_TABLET | Freq: Every day | ORAL | 2 refills | Status: DC
Start: 2021-07-31 — End: 2022-04-16

## 2021-07-31 NOTE — Progress Notes (Signed)
[facility] [address] [place], [place] [address]  Internal MEDICINE  Office Visit Note  Patient Name: Kristin Cruz  [id number]  [id number]  Date of Service: 07/31/2021  Chief Complaint  Patient presents with   Follow-up   Diabetes   Hyperlipidemia    HPI Pt is here for routine follow up -Farxiga 10mg  added last visit and told to increase tresiba by 1 unit each time for sugar over 200 for a max of 25units. 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 Does drink water as well. -BG fasting 120-125, 200-300 an hour after eating.  -Has been tolerating the and has been taking tresiba and the highest she has gotten to is 22units. -tresiba sample box given -Discussed A1c improving but no where near controlled. Will need to really work on diet and exercise and will add low dose 1 mg amaryl to help further. If not continuing to improve will need endocrinology referral.  Current Medication: Outpatient Encounter Medications as of 07/31/2021  Medication Sig   Accu-Chek FastClix Lancets MISC Use as directed to check sugars twice daily. Dx e11.65   aspirin 81 MG chewable tablet Chew by mouth.   glimepiride (AMARYL) 1 MG tablet Take 1 tablet (1 mg total) by mouth daily with breakfast.   glucose blood (ACCU-CHEK GUIDE) test strip Use as instructed twice a day diag E11.65   Insulin Pen Needle (PEN NEEDLES) 32G X 4 MM MISC 1 each by Does not apply route daily. To use with insulin dosing up to three times daily as needed.  E11.9   omeprazole (PRILOSEC) 40 MG capsule TAKE 1 TABLET ONCE A DAY 30 MINUTES BEFORE MEAL   Vitamin D, Ergocalciferol, (DRISDOL) 1.25 MG (50000 UNIT) CAPS capsule TAKE ONE CAPSULE BY MOUTH WEEKLY FOR LOW VITAMIN D   [DISCONTINUED] dapagliflozin propanediol (FARXIGA) 10 MG TABS tablet Take 1 tablet (10 mg total) by mouth daily before breakfast.   [DISCONTINUED] insulin  degludec (TRESIBA FLEXTOUCH) 100 UNIT/ML FlexTouch Pen Inject 20 Units into the skin daily.   [DISCONTINUED] rosuvastatin (CRESTOR) 10 MG tablet Take 1 tablet (10 mg total) by mouth daily.   dapagliflozin propanediol (FARXIGA) 10 MG TABS tablet Take 1 tablet (10 mg total) by mouth daily before breakfast.   insulin degludec (TRESIBA FLEXTOUCH) 100 UNIT/ML FlexTouch Pen Inject 20 Units into the skin daily.   rosuvastatin (CRESTOR) 10 MG tablet Take 1 tablet (10 mg total) by mouth daily.   No facility-administered encounter medications on file as of 07/31/2021.    Surgical History: History reviewed. No pertinent surgical history.  Medical History: Past Medical History:  Diagnosis Date   Diabetes mellitus without complication (HCC)    GERD (gastroesophageal reflux disease)    High cholesterol     Family History: Family History  Problem Relation Age of Onset   Diabetes Mother    Alcohol abuse Son    Cancer Maternal Aunt    Breast cancer Neg Hx     Social History   Socioeconomic History   Marital status: Legally Separated    Spouse name: Not on file   Number of children: Not on file   Years of education: Not on file   Highest education level: Not on file  Occupational History   Not on file  Tobacco Use   Smoking status: Never   Smokeless tobacco: Current    Types: Snuff  Vaping Use   Vaping Use: Never used  Substance and Sexual Activity   Alcohol use: Not Currently   Drug use: Never   Sexual activity: Not on file  Other Topics Concern   Not on file  Social History Narrative   Not on file   Social Determinants of Health   Financial Resource Strain: Not on file  Food Insecurity: Not on file  Transportation Needs: Not on file  Physical Activity: Not on file  Stress: Not on file  Social Connections: Not on file  Intimate Partner Violence: Not on file      Review of Systems  Constitutional:  Positive for fatigue. Negative for chills and unexpected weight  change.  HENT:  Negative for congestion, postnasal drip, rhinorrhea, sneezing and sore throat.   Eyes:  Negative for redness.  Respiratory:  Negative for cough, chest tightness and shortness of breath.   Cardiovascular:  Negative for chest pain and palpitations.  Gastrointestinal:  Negative for abdominal pain, constipation, diarrhea, nausea and vomiting.  Genitourinary:  Negative for dysuria and frequency.  Musculoskeletal:  Negative for arthralgias, back pain, joint swelling and neck pain.  Skin:  Negative for rash.  Neurological: Negative.  Negative for tremors and numbness.  Hematological:  Negative for adenopathy. Does not bruise/bleed easily.  Psychiatric/Behavioral:  Positive for behavioral problems (Depression) and sleep disturbance. Negative for suicidal ideas. The patient is not nervous/anxious.    Vital Signs: BP 97/69   Pulse 83   Temp 98.3 F (36.8 C)   Resp 16   Ht 5' 1.75" (1.568 m)   Wt 122 lb 6.4 oz (55.5 kg)   SpO2 97%   BMI 22.57 kg/m    Physical Exam Vitals and nursing note reviewed.  Constitutional:      General: She is not in acute distress.    Appearance: She is well-developed and normal weight. She is not diaphoretic.  HENT:     Head: Normocephalic and atraumatic.     Mouth/Throat:     Pharynx: No oropharyngeal exudate.  Eyes:     Pupils: Pupils are equal, round, and reactive to light.  Neck:     Thyroid: No thyromegaly.     Vascular: No JVD.     Trachea: No tracheal deviation.  Cardiovascular:     Rate and Rhythm: Normal rate and regular rhythm.     Heart sounds: Normal heart sounds. No murmur heard.   No friction rub. No gallop.  Pulmonary:     Effort: Pulmonary effort is normal. No respiratory distress.     Breath sounds: No wheezing or rales.  Chest:     Chest wall: No tenderness.  Abdominal:     General: Bowel sounds are normal.     Palpations: Abdomen is soft.  Musculoskeletal:        General: Normal range of motion.     Cervical  back: Normal range of motion and neck supple.  Lymphadenopathy:     Cervical: No cervical adenopathy.  Skin:    General: Skin is warm and dry.  Neurological:     Mental Status: She is alert and oriented to person, place, and time.     Cranial Nerves: No cranial nerve deficit.  Psychiatric:        Behavior: Behavior normal.        Thought Content: Thought content normal.        Judgment: Judgment normal.       Assessment/Plan: 1. Type 2 diabetes mellitus with hyperglycemia, with long-term current use of insulin (HCC) - POCT HgB  A1C is 10.9 which is an improvement from 11.8 at last check however patient understands that this is far from being well controlled and will need to further improve diet and exercise in addition medication adherence.  We will continue on Farxiga as well as 22 units of Tresiba.  Patient may increase by 1 unit for sugars over 200 not to exceed 25 minutes.  We will also start on 1 mg of Amaryl.  Patient should continue to monitor BG closely and understands that if her numbers are not improving significantly at next visit then an endocrinology referral will be placed - glimepiride (AMARYL) 1 MG tablet; Take 1 tablet (1 mg total) by mouth daily with breakfast.  Dispense: 30 tablet; Refill: 2 - dapagliflozin propanediol (FARXIGA) 10 MG TABS tablet; Take 1 tablet (10 mg total) by mouth daily before breakfast.  Dispense: 30 tablet; Refill: 2 - insulin degludec (TRESIBA FLEXTOUCH) 100 UNIT/ML FlexTouch Pen; Inject 20 Units into the skin daily.  Dispense: 3 mL; Refill: 1  2. Mixed hyperlipidemia Continue Crestor - rosuvastatin (CRESTOR) 10 MG tablet; Take 1 tablet (10 mg total) by mouth daily.  Dispense: 30 tablet; Refill: 3  3. Shift work sleep disorder Patient continues to work night shift which alters her eating habits and medication timings and was educated on ensuring that she gets enough sleep and eats on a regular basis without overuse of caffeine such as red bull and  sodas which also raise her BG.   General Counseling: tarea skillman understanding of the findings of todays visit and agrees with plan of treatment. I have discussed any further diagnostic evaluation that may be needed or ordered today. We also reviewed her medications today. she has been encouraged to call the office with any questions or concerns that should arise related to todays visit.    Orders Placed This Encounter  Procedures   POCT HgB A1C    Meds ordered this encounter  Medications   glimepiride (AMARYL) 1 MG tablet    Sig: Take 1 tablet (1 mg total) by mouth daily with breakfast.    Dispense:  30 tablet    Refill:  2   dapagliflozin propanediol (FARXIGA) 10 MG TABS tablet    Sig: Take 1 tablet (10 mg total) by mouth daily before breakfast.    Dispense:  30 tablet    Refill:  2   insulin degludec (TRESIBA FLEXTOUCH) 100 UNIT/ML FlexTouch Pen    Sig: Inject 20 Units into the skin daily.    Dispense:  3 mL    Refill:  1   rosuvastatin (CRESTOR) 10 MG tablet    Sig: Take 1 tablet (10 mg total) by mouth daily.    Dispense:  30 tablet    Refill:  3    This patient was seen by Lynn Ito, PA-C in collaboration with Dr. Beverely Risen as a part of collaborative care agreement.   Total time spent:35 Minutes Time spent includes review of chart, medications, test results, and follow up plan with the patient.      Dr Lyndon Code Internal medicine

## 2021-10-27 ENCOUNTER — Other Ambulatory Visit: Payer: Self-pay | Admitting: Physician Assistant

## 2021-10-27 DIAGNOSIS — B379 Candidiasis, unspecified: Secondary | ICD-10-CM

## 2021-10-28 ENCOUNTER — Other Ambulatory Visit: Payer: Self-pay | Admitting: Physician Assistant

## 2021-10-28 ENCOUNTER — Telehealth: Payer: Self-pay

## 2021-10-28 MED ORDER — FLUCONAZOLE 150 MG PO TABS: 150.0000 mg | ORAL_TABLET | Freq: Once | ORAL | 0 refills | Status: AC

## 2021-10-28 NOTE — Telephone Encounter (Signed)
error 

## 2021-10-30 ENCOUNTER — Ambulatory Visit: Payer: No Typology Code available for payment source | Admitting: Physician Assistant

## 2022-03-17 IMAGING — MG MM DIGITAL SCREENING BILAT W/ TOMO AND CAD
8 series · 8 of 24 positions shown · non-contrast
Comparison: Previous exam(s).

CLINICAL DATA: Screening.

EXAM:
DIGITAL SCREENING BILATERAL MAMMOGRAM WITH TOMOSYNTHESIS AND CAD
TECHNIQUE: Bilateral screening digital craniocaudal and mediolateral oblique
mammograms were obtained. Bilateral screening digital breast
tomosynthesis was performed. The images were evaluated with
computer-aided detection.

[L CC synth-2D]
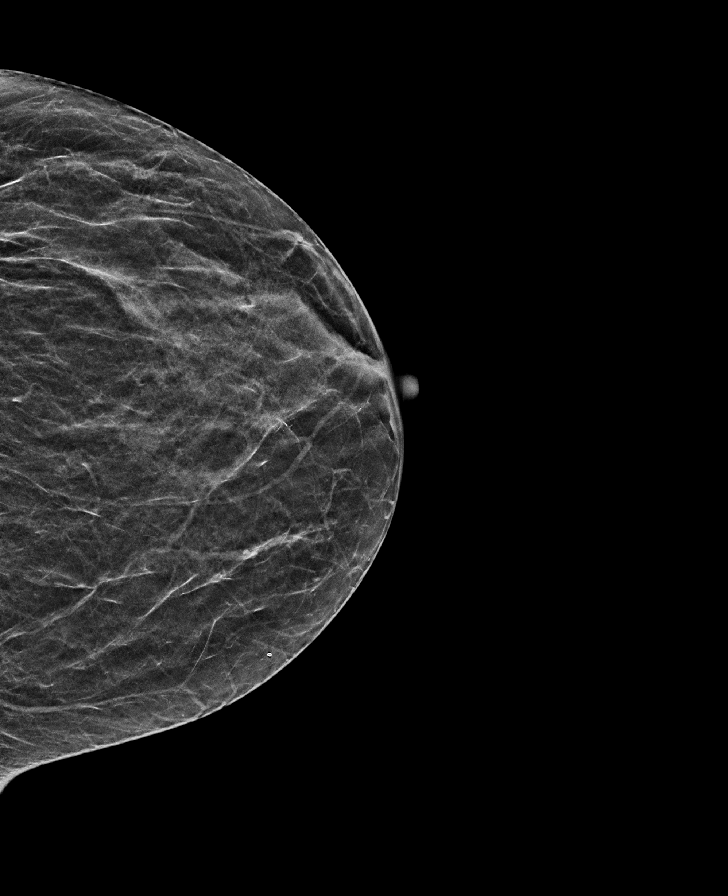

[L MLO synth-2D]
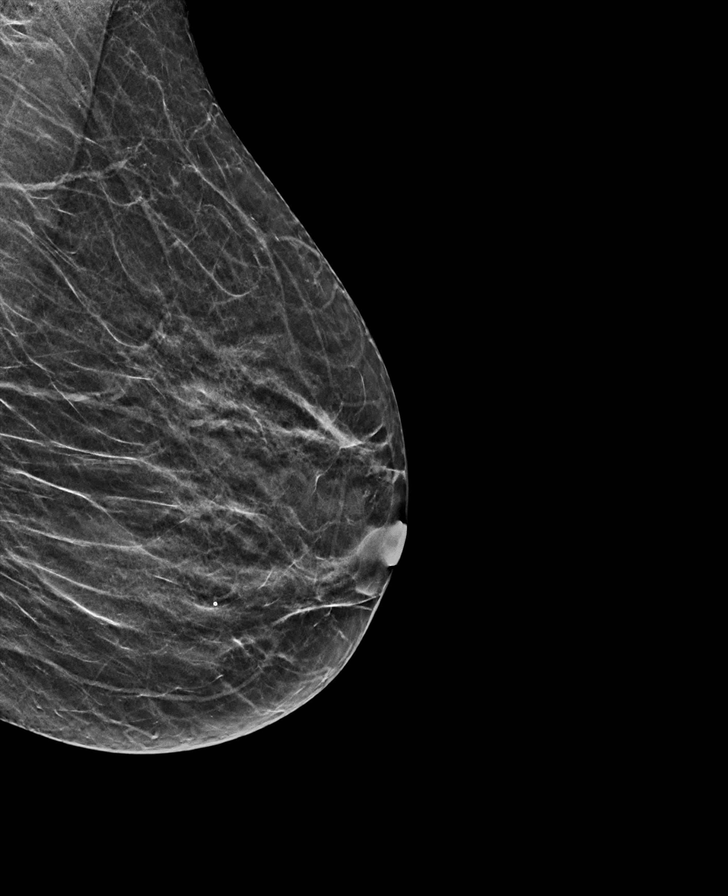

[R MLO synth-2D]
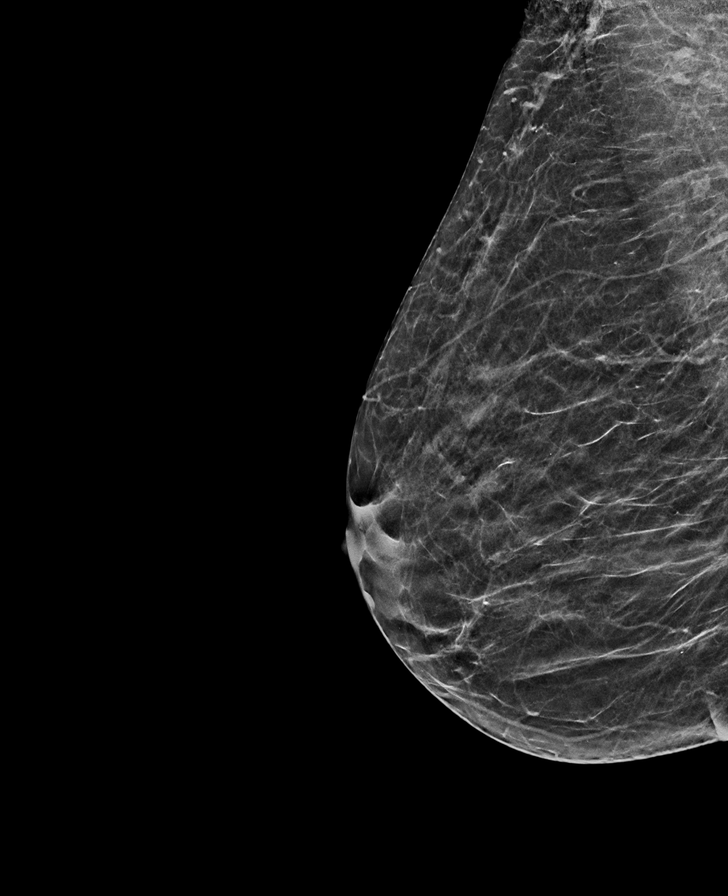

[R CC synth-2D]
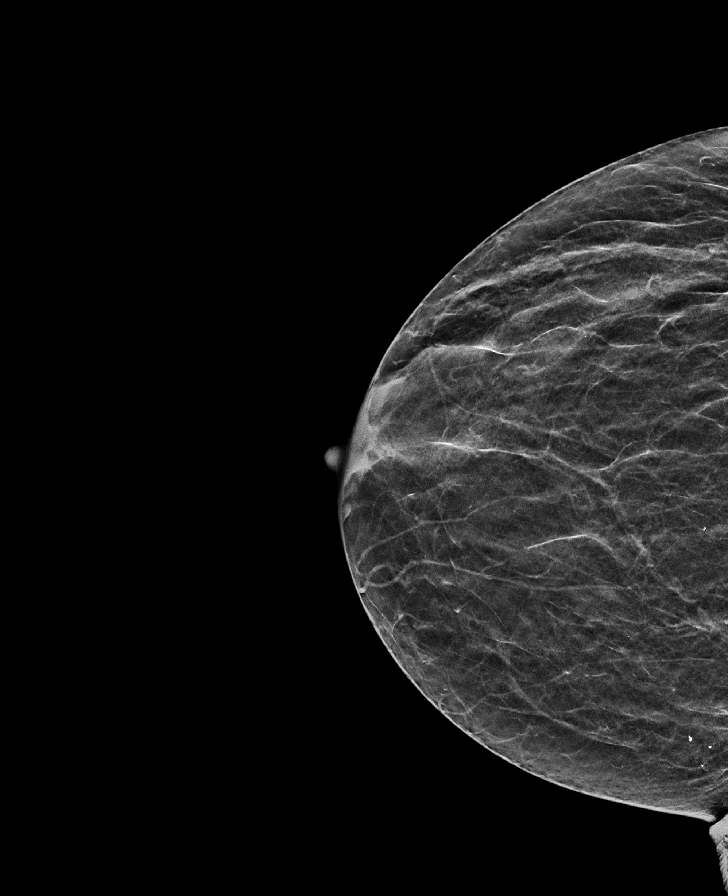

[L MLO tomo · tomo slice 21/42.0]
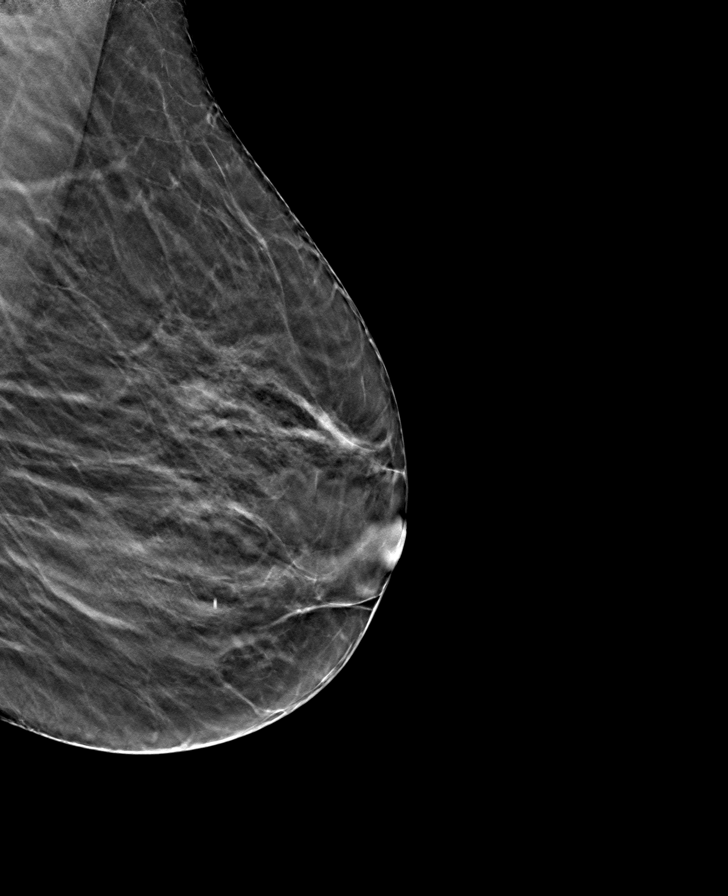

[R CC tomo · tomo slice 21/42.0]
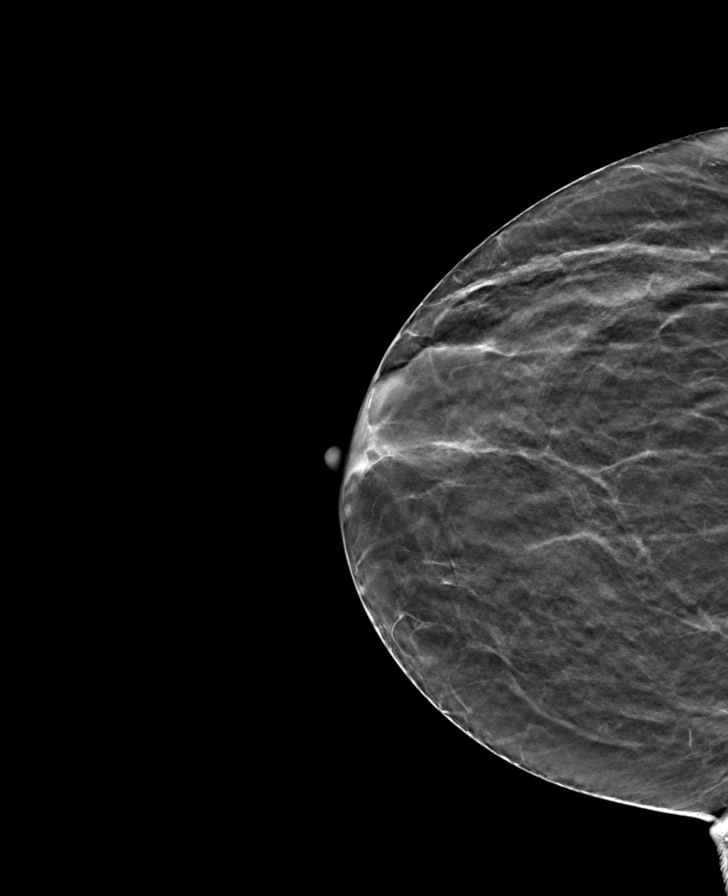

[L CC tomo · tomo slice 21/41.0]
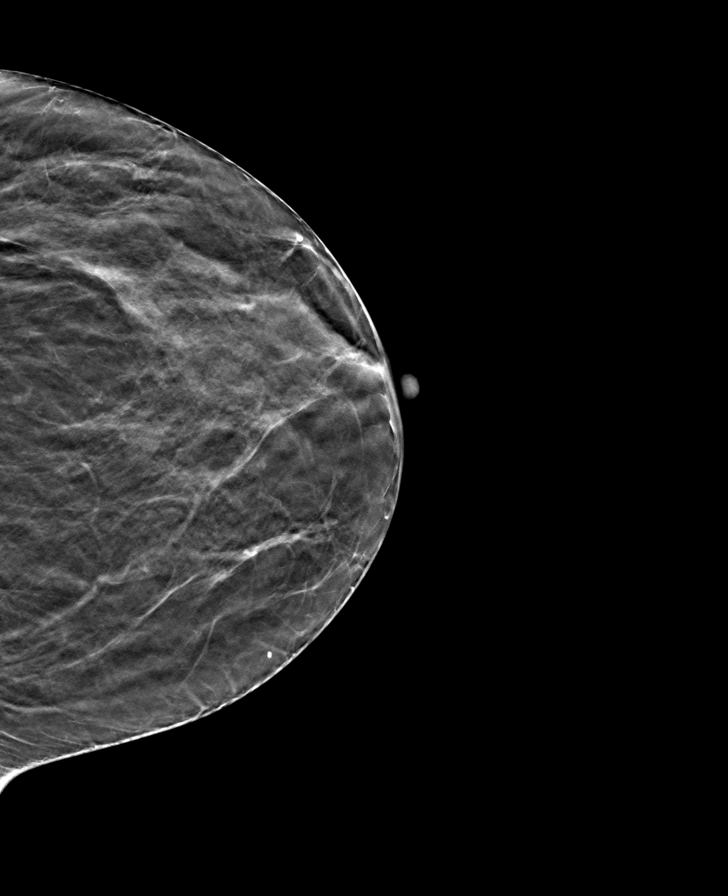

[R MLO tomo · tomo slice 24/47.0]
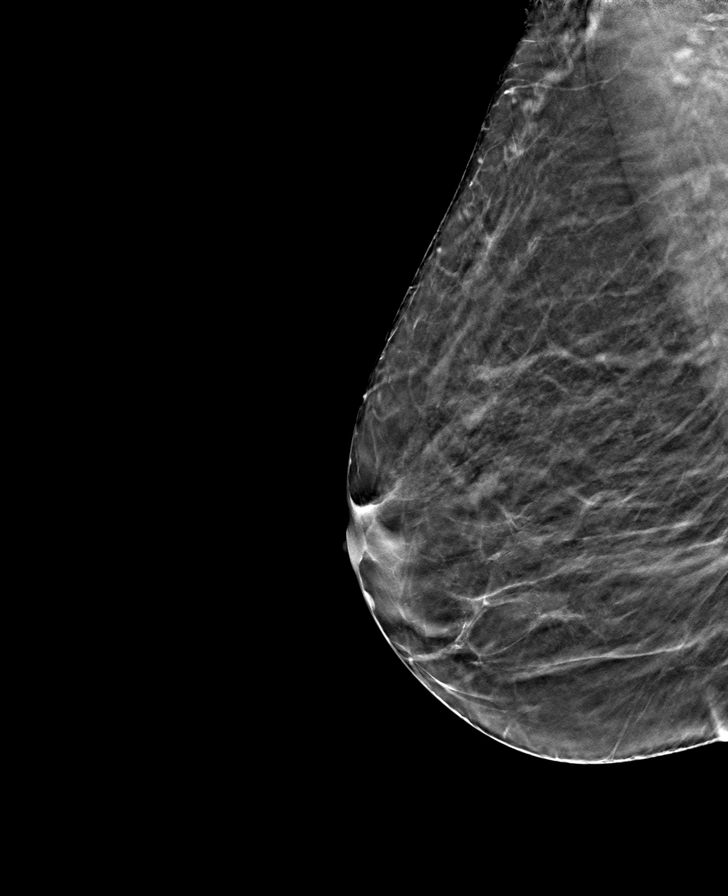

[8 of 24 positions shown; findings below may reference images not displayed]

ACR Breast Density Category c: The breast tissue is heterogeneously
dense, which may obscure small masses.
FINDINGS: There are no findings suspicious for malignancy.
IMPRESSION: No mammographic evidence of malignancy. A result letter of this
screening mammogram will be mailed directly to the patient.

RECOMMENDATION:
Screening mammogram in one year. (Code:Q3-W-BC3)

BI-RADS CATEGORY  1: Negative.

## 2022-04-01 ENCOUNTER — Ambulatory Visit: Payer: No Typology Code available for payment source | Admitting: Physician Assistant

## 2022-04-16 ENCOUNTER — Encounter: Payer: Self-pay | Admitting: Physician Assistant

## 2022-04-16 ENCOUNTER — Ambulatory Visit (INDEPENDENT_AMBULATORY_CARE_PROVIDER_SITE_OTHER): Payer: No Typology Code available for payment source | Admitting: Physician Assistant

## 2022-04-16 VITALS — BP 108/68 | HR 96 | Temp 98.3°F | Resp 16 | Ht 61.25 in | Wt 127.2 lb

## 2022-04-16 DIAGNOSIS — E782 Mixed hyperlipidemia: Secondary | ICD-10-CM

## 2022-04-16 DIAGNOSIS — E1165 Type 2 diabetes mellitus with hyperglycemia: Secondary | ICD-10-CM | POA: Diagnosis not present

## 2022-04-16 DIAGNOSIS — F331 Major depressive disorder, recurrent, moderate: Secondary | ICD-10-CM | POA: Diagnosis not present

## 2022-04-16 DIAGNOSIS — Z794 Long term (current) use of insulin: Secondary | ICD-10-CM

## 2022-04-16 DIAGNOSIS — N390 Urinary tract infection, site not specified: Secondary | ICD-10-CM

## 2022-04-16 DIAGNOSIS — R300 Dysuria: Secondary | ICD-10-CM

## 2022-04-16 DIAGNOSIS — R109 Unspecified abdominal pain: Secondary | ICD-10-CM | POA: Diagnosis not present

## 2022-04-16 DIAGNOSIS — Z91148 Patient's other noncompliance with medication regimen for other reason: Secondary | ICD-10-CM

## 2022-04-16 DIAGNOSIS — R10A1 Flank pain, right side: Secondary | ICD-10-CM

## 2022-04-16 LAB — POCT GLYCOSYLATED HEMOGLOBIN (HGB A1C): Hemoglobin A1C: 11.6 % — AB (ref 4.0–5.6)

## 2022-04-16 MED ORDER — DAPAGLIFLOZIN PROPANEDIOL 10 MG PO TABS: 10.0000 mg | ORAL_TABLET | Freq: Every day | ORAL | 2 refills | Status: DC

## 2022-04-16 MED ORDER — PEN NEEDLES 32G X 4 MM MISC: 1.0000 | Freq: Every day | 5 refills | Status: DC

## 2022-04-16 MED ORDER — GLIMEPIRIDE 1 MG PO TABS: 1.0000 mg | ORAL_TABLET | Freq: Every day | ORAL | 2 refills | Status: DC

## 2022-04-16 MED ORDER — ACCU-CHEK FASTCLIX LANCETS MISC: 3 refills | Status: DC

## 2022-04-16 MED ORDER — ESCITALOPRAM OXALATE 5 MG PO TABS: 5.0000 mg | ORAL_TABLET | Freq: Every day | ORAL | 2 refills | Status: DC

## 2022-04-16 MED ORDER — TRULICITY 0.75 MG/0.5ML ~~LOC~~ SOAJ: 0.7500 mg | SUBCUTANEOUS | 2 refills | Status: DC

## 2022-04-16 MED ORDER — ROSUVASTATIN CALCIUM 10 MG PO TABS: 10.0000 mg | ORAL_TABLET | Freq: Every day | ORAL | 3 refills | Status: DC

## 2022-04-16 MED ORDER — FLUCONAZOLE 150 MG PO TABS: 150.0000 mg | ORAL_TABLET | Freq: Once | ORAL | 0 refills | Status: AC

## 2022-04-16 MED ORDER — ACCU-CHEK GUIDE VI STRP: ORAL_STRIP | 1 refills | Status: DC

## 2022-04-16 MED ORDER — TRESIBA FLEXTOUCH 100 UNIT/ML ~~LOC~~ SOPN: 20.0000 [IU] | PEN_INJECTOR | Freq: Every day | SUBCUTANEOUS | 1 refills | Status: DC

## 2022-04-16 NOTE — Progress Notes (Signed)
[facility] [address] [place], [place] [address]  Internal MEDICINE  Office Visit Note  Patient Name: Kristin Cruz  [id number]  [id number]  Date of Service: 04/20/2022  Chief Complaint  Patient presents with   Diabetes   Pain    On right lower side from front side to back   Depression    Pt needs evaluated from PHQ scale    HPI Pt is here for routine follow up, but has not been seen since Sept 2022 -Pt's right side has been bothering her for the past month or so. Is tender to the touch along right flank. Does also have urgency and frequency though has this already from uncontrolled diabetes. Will check urine -Admits she doesn't take her Marcelline Deist or glimepiride daily. She states she forgets or just doesn't take it at times. Discussed the importance of taking these as prescribed as her sugar levels remain uncontrolled and have worsening since last check -Does take tresiba 20 units daily at about 5pm when she eats supper -Sugars checked at 6-7pm and are often around 300, but this is after she has eaten and taken tresiba. Has not been taken while fasting. Does not check sugars other times during the day either. 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 -A lot of stressors and things that keep upsetting her.  -Doesn't like her job but wants to stick it out since close to retiring and has been there 22years. -Also had to leave apartment and move in with family and money can be tight. -She also worries about her grandbabies and reports they are positive motivators for her. -Discussed her PHQ 9 score and concerns regarding her depression going untreated. She denies SI/HI. She is willing to start on medication to see if this can help improve her mood. Discussed we likely will need to increase this, but will start low and she should  contact office or seek help if any worsening or thoughts of self harm emerge  Current Medication: Outpatient Encounter Medications as of 04/16/2022  Medication Sig   aspirin 81 MG chewable tablet Chew by mouth.   Dulaglutide (TRULICITY) 0.75 MG/0.5ML SOPN Inject 0.75 mg into the skin once a week.   escitalopram (LEXAPRO) 5 MG tablet Take 1 tablet (5 mg total) by mouth daily.   [EXPIRED] fluconazole (DIFLUCAN) 150 MG tablet Take 1 tablet (150 mg total) by mouth once for 1 dose.   omeprazole (PRILOSEC) 40 MG capsule TAKE 1 TABLET ONCE A DAY 30 MINUTES BEFORE MEAL   Vitamin D, Ergocalciferol, (DRISDOL) 1.25 MG (50000 UNIT) CAPS capsule TAKE ONE CAPSULE BY MOUTH WEEKLY FOR LOW VITAMIN D   [DISCONTINUED] Accu-Chek FastClix Lancets MISC Use as directed to check sugars twice daily. Dx e11.65   [DISCONTINUED] dapagliflozin propanediol (FARXIGA) 10 MG TABS tablet Take 1 tablet (10 mg total) by mouth daily before breakfast.   [DISCONTINUED] glimepiride (AMARYL) 1 MG tablet Take 1 tablet (1 mg total) by mouth daily with breakfast.   [DISCONTINUED] glucose blood (ACCU-CHEK GUIDE) test strip Use as instructed twice a day diag E11.65   [DISCONTINUED] insulin degludec (TRESIBA FLEXTOUCH) 100 UNIT/ML FlexTouch Pen Inject 20 Units into the skin daily.   [DISCONTINUED] Insulin Pen Needle (PEN NEEDLES) 32G X 4 MM MISC 1 each by Does not apply route daily. To use with insulin dosing up to three  times daily as needed.  E11.9   [DISCONTINUED] rosuvastatin (CRESTOR) 10 MG tablet Take 1 tablet (10 mg total) by mouth daily.   Accu-Chek FastClix Lancets MISC Use as directed to check sugars twice daily. Dx e11.65   dapagliflozin propanediol (FARXIGA) 10 MG TABS tablet Take 1 tablet (10 mg total) by mouth daily before breakfast.   glimepiride (AMARYL) 1 MG tablet Take 1 tablet (1 mg total) by mouth daily with breakfast.   glucose blood (ACCU-CHEK GUIDE) test strip Use as instructed twice a day diag E11.65   insulin degludec  (TRESIBA FLEXTOUCH) 100 UNIT/ML FlexTouch Pen Inject 20 Units into the skin daily.   Insulin Pen Needle (PEN NEEDLES) 32G X 4 MM MISC 1 each by Does not apply route daily. To use with insulin dosing up to three times daily as needed.  E11.9   rosuvastatin (CRESTOR) 10 MG tablet Take 1 tablet (10 mg total) by mouth daily.   No facility-administered encounter medications on file as of 04/16/2022.    Surgical History: History reviewed. No pertinent surgical history.  Medical History: Past Medical History:  Diagnosis Date   Diabetes mellitus without complication (HCC)    GERD (gastroesophageal reflux disease)    High cholesterol     Family History: Family History  Problem Relation Age of Onset   Cancer Mother    Diabetes Mother    Alcohol abuse Son    Cancer Maternal Aunt    Breast cancer Neg Hx     Social History   Socioeconomic History   Marital status: Legally Separated    Spouse name: Not on file   Number of children: Not on file   Years of education: Not on file   Highest education level: Not on file  Occupational History   Not on file  Tobacco Use   Smoking status: Never   Smokeless tobacco: Current    Types: Snuff  Vaping Use   Vaping Use: Never used  Substance and Sexual Activity   Alcohol use: Not Currently   Drug use: Never   Sexual activity: Not on file  Other Topics Concern   Not on file  Social History Narrative   Not on file   Social Determinants of Health   Financial Resource Strain: Not on file  Food Insecurity: Not on file  Transportation Needs: Not on file  Physical Activity: Not on file  Stress: Not on file  Social Connections: Not on file  Intimate Partner Violence: Not on file      Review of Systems  Constitutional:  Positive for fatigue. Negative for chills and unexpected weight change.  HENT:  Negative for congestion, postnasal drip, rhinorrhea, sneezing and sore throat.   Eyes:  Negative for redness.  Respiratory:  Negative for  cough, chest tightness and shortness of breath.   Cardiovascular:  Negative for chest pain and palpitations.  Gastrointestinal:  Positive for abdominal pain. Negative for constipation, diarrhea, nausea and vomiting.  Genitourinary:  Positive for flank pain, frequency and urgency. Negative for dysuria.  Musculoskeletal:  Negative for arthralgias, back pain, joint swelling and neck pain.  Skin:  Negative for rash.  Neurological: Negative.  Negative for tremors and numbness.  Hematological:  Negative for adenopathy. Does not bruise/bleed easily.  Psychiatric/Behavioral:  Positive for behavioral problems (Depression) and sleep disturbance. Negative for suicidal ideas. The patient is not nervous/anxious.    Vital Signs: BP 108/68   Pulse 96   Temp 98.3 F (36.8 C)   Resp 16  Ht 5' 1.25" (1.556 m)   Wt 127 lb 3.2 oz (57.7 kg)   SpO2 97%   BMI 23.84 kg/m    Physical Exam Vitals and nursing note reviewed.  Constitutional:      General: She is not in acute distress.    Appearance: She is well-developed and normal weight. She is not diaphoretic.  HENT:     Head: Normocephalic and atraumatic.     Mouth/Throat:     Pharynx: No oropharyngeal exudate.  Eyes:     Pupils: Pupils are equal, round, and reactive to light.  Neck:     Thyroid: No thyromegaly.     Vascular: No JVD.     Trachea: No tracheal deviation.  Cardiovascular:     Rate and Rhythm: Normal rate and regular rhythm.     Heart sounds: Normal heart sounds. No murmur heard.   No friction rub. No gallop.  Pulmonary:     Effort: Pulmonary effort is normal. No respiratory distress.     Breath sounds: No wheezing or rales.  Chest:     Chest wall: No tenderness.  Abdominal:     General: Bowel sounds are normal.     Palpations: Abdomen is soft. There is no mass.     Tenderness: There is abdominal tenderness. There is right CVA tenderness. There is no left CVA tenderness or guarding.     Comments: Right side abdominal/flank  pain that is tender to touch  Musculoskeletal:        General: Normal range of motion.     Cervical back: Normal range of motion and neck supple.  Lymphadenopathy:     Cervical: No cervical adenopathy.  Skin:    General: Skin is warm and dry.  Neurological:     Mental Status: She is alert and oriented to person, place, and time.     Cranial Nerves: No cranial nerve deficit.  Psychiatric:        Thought Content: Thought content normal.        Judgment: Judgment normal.       Assessment/Plan: 1. Type 2 diabetes mellitus with hyperglycemia, with long-term current use of insulin (HCC) - POCT glycosylated hemoglobin (Hb A1C) is 11.6 which is elevated from 10.9 last check. Continues to be very uncontrolled and discussed need for medication adherence and further monitoring. Will continue current medications and take them daily now and will also start on trulicity weekly. - dapagliflozin propanediol (FARXIGA) 10 MG TABS tablet; Take 1 tablet (10 mg total) by mouth daily before breakfast.  Dispense: 30 tablet; Refill: 2 - glimepiride (AMARYL) 1 MG tablet; Take 1 tablet (1 mg total) by mouth daily with breakfast.  Dispense: 30 tablet; Refill: 2 - insulin degludec (TRESIBA FLEXTOUCH) 100 UNIT/ML FlexTouch Pen; Inject 20 Units into the skin daily.  Dispense: 3 mL; Refill: 1 - Insulin Pen Needle (PEN NEEDLES) 32G X 4 MM MISC; 1 each by Does not apply route daily. To use with insulin dosing up to three times daily as needed.  E11.9  Dispense: 100 each; Refill: 5  2. Moderate episode of recurrent major depressive disorder (HCC) Will start low dose Lexapro, may need to titrate up. Advised to contact office or seek help if any worsening or thoughts of self harm - escitalopram (LEXAPRO) 5 MG tablet; Take 1 tablet (5 mg total) by mouth daily.  Dispense: 30 tablet; Refill: 2  3. Mixed hyperlipidemia - rosuvastatin (CRESTOR) 10 MG tablet; Take 1 tablet (10 mg total) by mouth daily.  Dispense: 30 tablet;  Refill: 3  4. Right flank pain Possibly due to UTI and will check culture. May also be muscular. May need further evaluation if no infection discovered. Will contact office if any worsening  5. Urinary tract infection without hematuria, site unspecified Possibly contributing to flank pain and will send for culture and treat accordingly - CULTURE, URINE COMPREHENSIVE  6. Dysuria - POCT Urinalysis Dipstick  7. Nonadherence to medication Discussed the importance of medication adherence   General Counseling: Shatika verbalizes understanding of the findings of todays visit and agrees with plan of treatment. I have discussed any further diagnostic evaluation that may be needed or ordered today. We also reviewed her medications today. she has been encouraged to call the office with any questions or concerns that should arise related to todays visit.    Orders Placed This Encounter  Procedures   CULTURE, URINE COMPREHENSIVE   POCT glycosylated hemoglobin (Hb A1C)   POCT Urinalysis Dipstick    Meds ordered this encounter  Medications   Dulaglutide (TRULICITY) 0.75 MG/0.5ML SOPN    Sig: Inject 0.75 mg into the skin once a week.    Dispense:  2 mL    Refill:  2   dapagliflozin propanediol (FARXIGA) 10 MG TABS tablet    Sig: Take 1 tablet (10 mg total) by mouth daily before breakfast.    Dispense:  30 tablet    Refill:  2   glimepiride (AMARYL) 1 MG tablet    Sig: Take 1 tablet (1 mg total) by mouth daily with breakfast.    Dispense:  30 tablet    Refill:  2   insulin degludec (TRESIBA FLEXTOUCH) 100 UNIT/ML FlexTouch Pen    Sig: Inject 20 Units into the skin daily.    Dispense:  3 mL    Refill:  1   rosuvastatin (CRESTOR) 10 MG tablet    Sig: Take 1 tablet (10 mg total) by mouth daily.    Dispense:  30 tablet    Refill:  3   Insulin Pen Needle (PEN NEEDLES) 32G X 4 MM MISC    Sig: 1 each by Does not apply route daily. To use with insulin dosing up to three times daily as  needed.  E11.9    Dispense:  100 each    Refill:  5   glucose blood (ACCU-CHEK GUIDE) test strip    Sig: Use as instructed twice a day diag E11.65    Dispense:  100 each    Refill:  1   Accu-Chek FastClix Lancets MISC    Sig: Use as directed to check sugars twice daily. Dx e11.65    Dispense:  100 each    Refill:  3   fluconazole (DIFLUCAN) 150 MG tablet    Sig: Take 1 tablet (150 mg total) by mouth once for 1 dose.    Dispense:  1 tablet    Refill:  0   escitalopram (LEXAPRO) 5 MG tablet    Sig: Take 1 tablet (5 mg total) by mouth daily.    Dispense:  30 tablet    Refill:  2    This patient was seen by Lynn Ito, PA-C in collaboration with Dr. Beverely Risen as a part of collaborative care agreement.   Total time spent:35 Minutes Time spent includes review of chart, medications, test results, and follow up plan with the patient.      Dr Lyndon Code Internal medicine

## 2022-04-20 ENCOUNTER — Telehealth: Payer: Self-pay

## 2022-04-20 LAB — POCT URINALYSIS DIPSTICK
Bilirubin, UA: NEGATIVE
Blood, UA: NEGATIVE
Glucose, UA: POSITIVE — AB
Ketones, UA: NEGATIVE
Leukocytes, UA: NEGATIVE
Nitrite, UA: NEGATIVE
Protein, UA: NEGATIVE
Spec Grav, UA: 1.01 (ref 1.010–1.025)
Urobilinogen, UA: 0.2 U/dL
pH, UA: 6 (ref 5.0–8.0)

## 2022-04-20 LAB — CULTURE, URINE COMPREHENSIVE

## 2022-04-20 NOTE — Telephone Encounter (Signed)
Called pt and informed her that we don't have any samples at the moment for Trulicity and I will work on her PA as quick as I can

## 2022-04-21 ENCOUNTER — Other Ambulatory Visit: Payer: Self-pay | Admitting: Physician Assistant

## 2022-04-21 DIAGNOSIS — N390 Urinary tract infection, site not specified: Secondary | ICD-10-CM

## 2022-04-21 MED ORDER — AMPICILLIN 500 MG PO CAPS: 500.0000 mg | ORAL_CAPSULE | Freq: Three times a day (TID) | ORAL | 0 refills | Status: DC

## 2022-04-23 ENCOUNTER — Telehealth: Payer: Self-pay

## 2022-04-23 NOTE — Telephone Encounter (Signed)
LMOM letting pt know that her UA showed she has a UTI and we sent ampicillin to her pharmacy,  advised pt if any questions or concerns to call office

## 2022-04-23 NOTE — Telephone Encounter (Signed)
Pt advised farxiga samples ready for  pickup

## 2022-04-27 ENCOUNTER — Telehealth: Payer: Self-pay

## 2022-04-30 MED ORDER — TRULICITY 0.75 MG/0.5ML ~~LOC~~ SOAJ: 0.7500 mg | SUBCUTANEOUS | 2 refills | Status: DC

## 2022-04-30 NOTE — Telephone Encounter (Signed)
PA for Trulicity sent 04/27/22 and came back approved.  Sent new prescription to pharmacy with approval info.

## 2022-05-17 ENCOUNTER — Ambulatory Visit: Payer: No Typology Code available for payment source | Admitting: Physician Assistant

## 2022-06-02 ENCOUNTER — Ambulatory Visit (INDEPENDENT_AMBULATORY_CARE_PROVIDER_SITE_OTHER): Payer: PRIVATE HEALTH INSURANCE | Admitting: Nurse Practitioner

## 2022-06-02 ENCOUNTER — Encounter: Payer: Self-pay | Admitting: Nurse Practitioner

## 2022-06-02 VITALS — BP 90/59 | HR 78 | Temp 97.6°F | Ht 61.42 in | Wt 125.1 lb

## 2022-06-02 DIAGNOSIS — F331 Major depressive disorder, recurrent, moderate: Secondary | ICD-10-CM

## 2022-06-02 DIAGNOSIS — Z7689 Persons encountering health services in other specified circumstances: Secondary | ICD-10-CM

## 2022-06-02 DIAGNOSIS — Z1231 Encounter for screening mammogram for malignant neoplasm of breast: Secondary | ICD-10-CM | POA: Diagnosis not present

## 2022-06-02 DIAGNOSIS — Z794 Long term (current) use of insulin: Secondary | ICD-10-CM

## 2022-06-02 DIAGNOSIS — E1165 Type 2 diabetes mellitus with hyperglycemia: Secondary | ICD-10-CM | POA: Diagnosis not present

## 2022-06-02 MED ORDER — ESCITALOPRAM OXALATE 5 MG PO TABS: 5.0000 mg | ORAL_TABLET | Freq: Every day | ORAL | 2 refills | Status: DC

## 2022-06-02 MED ORDER — TRESIBA FLEXTOUCH 100 UNIT/ML ~~LOC~~ SOPN: 25.0000 [IU] | PEN_INJECTOR | Freq: Every day | SUBCUTANEOUS | 1 refills | Status: DC

## 2022-06-02 MED ORDER — DAPAGLIFLOZIN PROPANEDIOL 10 MG PO TABS: 10.0000 mg | ORAL_TABLET | Freq: Every day | ORAL | 1 refills | Status: DC

## 2022-06-02 NOTE — Progress Notes (Signed)
New Patient Office Visit  Subjective    Patient ID: Jaziyah Gradel, female    DOB: 1961/08/20  Age: 61 y.o. MRN: [id number]  CC:  Chief Complaint  Patient presents with   New Patient (Initial Visit)    HPI Bora Bost presents to establish care Coming from different local PCP.  -very high blood sugars. Unable to tolerate glimepiride 1 mg. Causes blood sugars to drop while sleeping. Not taking trulicity as it is very expensive.  -high depression score. Was started on lexapro. Did not start as she was afraid it would make her feel very tired.   Outpatient Encounter Medications as of 06/02/2022  Medication Sig   Accu-Chek FastClix Lancets MISC Use as directed to check sugars twice daily. Dx e11.65   ampicillin (PRINCIPEN) 500 MG capsule Take 1 capsule (500 mg total) by mouth 3 (three) times daily.   aspirin 81 MG chewable tablet Chew by mouth.   glucose blood (ACCU-CHEK GUIDE) test strip Use as instructed twice a day diag E11.65   Insulin Pen Needle (PEN NEEDLES) 32G X 4 MM MISC 1 each by Does not apply route daily. To use with insulin dosing up to three times daily as needed.  E11.9   omeprazole (PRILOSEC) 40 MG capsule TAKE 1 TABLET ONCE A DAY 30 MINUTES BEFORE MEAL   rosuvastatin (CRESTOR) 10 MG tablet Take 1 tablet (10 mg total) by mouth daily.   Vitamin D, Ergocalciferol, (DRISDOL) 1.25 MG (50000 UNIT) CAPS capsule TAKE ONE CAPSULE BY MOUTH WEEKLY FOR LOW VITAMIN D   [DISCONTINUED] dapagliflozin propanediol (FARXIGA) 10 MG TABS tablet Take 1 tablet (10 mg total) by mouth daily before breakfast.   [DISCONTINUED] escitalopram (LEXAPRO) 5 MG tablet Take 1 tablet (5 mg total) by mouth daily.   [DISCONTINUED] glimepiride (AMARYL) 1 MG tablet Take 1 tablet (1 mg total) by mouth daily with breakfast.   [DISCONTINUED] insulin degludec (TRESIBA FLEXTOUCH) 100 UNIT/ML FlexTouch Pen Inject 20 Units into the skin daily.   dapagliflozin propanediol (FARXIGA) 10 MG TABS tablet  Take 1 tablet (10 mg total) by mouth daily before breakfast.   escitalopram (LEXAPRO) 5 MG tablet Take 1 tablet (5 mg total) by mouth daily.   insulin degludec (TRESIBA FLEXTOUCH) 100 UNIT/ML FlexTouch Pen Inject 25 Units into the skin daily.   [DISCONTINUED] Dulaglutide (TRULICITY) 0.75 MG/0.5ML SOPN Inject 0.75 mg into the skin once a week. (Patient not taking: Reported on 06/02/2022)   No facility-administered encounter medications on file as of 06/02/2022.    Past Medical History:  Diagnosis Date   Diabetes mellitus without complication (HCC)    GERD (gastroesophageal reflux disease)    High cholesterol     History reviewed. No pertinent surgical history.  Family History  Problem Relation Age of Onset   Cancer Mother    Diabetes Mother    Alcohol abuse Son    Cancer Maternal Aunt    Breast cancer Neg Hx     Social History   Socioeconomic History   Marital status: Legally Separated    Spouse name: Not on file   Number of children: Not on file   Years of education: Not on file   Highest education level: Not on file  Occupational History   Not on file  Tobacco Use   Smoking status: Never   Smokeless tobacco: Current    Types: Snuff  Vaping Use   Vaping Use: Never used  Substance and Sexual Activity   Alcohol use: Not Currently  Drug use: Never   Sexual activity: Not Currently  Other Topics Concern   Not on file  Social History Narrative   Not on file   Social Determinants of Health   Financial Resource Strain: Not on file  Food Insecurity: Not on file  Transportation Needs: Not on file  Physical Activity: Not on file  Stress: Not on file  Social Connections: Not on file  Intimate Partner Violence: Not on file    Review of Systems  Constitutional:  Positive for malaise/fatigue. Negative for chills and fever.  HENT:  Negative for congestion, sinus pain and sore throat.   Eyes: Negative.   Respiratory:  Negative for cough, shortness of breath and  wheezing.   Cardiovascular:  Negative for chest pain, palpitations and leg swelling.  Gastrointestinal:  Negative for constipation, diarrhea, nausea and vomiting.  Genitourinary: Negative.   Musculoskeletal:  Negative for myalgias.  Skin: Negative.   Neurological:  Negative for dizziness and headaches.  Endo/Heme/Allergies:  Does not bruise/bleed easily.       Very high blood sugars.   Psychiatric/Behavioral:  Positive for depression. The patient is not nervous/anxious.        Patient states that she will probably be depressed as long as she has to deal with her children.         Objective    Today's Vitals   06/02/22 0849  BP: (!) 90/59  Pulse: 78  Temp: 97.6 F (36.4 C)  SpO2: 97%  Weight: 125 lb 1.9 oz (56.8 kg)  Height: 5' 1.42" (1.56 m)   Body mass index is 23.32 kg/m.   Physical Exam Vitals and nursing note reviewed.  Constitutional:      Appearance: Normal appearance. She is well-developed.  HENT:     Head: Normocephalic and atraumatic.  Eyes:     Pupils: Pupils are equal, round, and reactive to light.  Cardiovascular:     Rate and Rhythm: Normal rate and regular rhythm.     Pulses: Normal pulses.     Heart sounds: Normal heart sounds.  Pulmonary:     Effort: Pulmonary effort is normal.     Breath sounds: Normal breath sounds.  Abdominal:     Palpations: Abdomen is soft.  Musculoskeletal:        General: Normal range of motion.     Cervical back: Normal range of motion and neck supple.  Lymphadenopathy:     Cervical: No cervical adenopathy.  Skin:    General: Skin is warm and dry.     Capillary Refill: Capillary refill takes less than 2 seconds.  Neurological:     General: No focal deficit present.     Mental Status: She is alert and oriented to person, place, and time.  Psychiatric:        Attention and Perception: Attention and perception normal.        Mood and Affect: Mood is depressed.        Speech: Speech normal.        Behavior: Behavior  normal. Behavior is cooperative.        Thought Content: Thought content normal.        Cognition and Memory: Cognition and memory normal.        Judgment: Judgment normal.    Lab Results  Component Value Date   HGBA1C 11.6 (A) 04/16/2022        Assessment & Plan:  1. Type 2 diabetes mellitus with hyperglycemia, with long-term current use of insulin (HCC)  Most recent HgbA1c 11.6. patient having trouble tolerating glimepiride. Will discontinue this and trulicity. Increase Tresiba to 25 units daily. Continue Farxiga 10 mg daily. Monitor blood sugars closely. Goal is to have blood sugars between 70 and 120.  Check HgbA1c and urine microalbumin in three months.  - dapagliflozin propanediol (FARXIGA) 10 MG TABS tablet; Take 1 tablet (10 mg total) by mouth daily before breakfast.  Dispense: 90 tablet; Refill: 1 - insulin degludec (TRESIBA FLEXTOUCH) 100 UNIT/ML FlexTouch Pen; Inject 25 Units into the skin daily.  Dispense: 3 mL; Refill: 1  2. Moderate episode of recurrent major depressive disorder (HCC) Depression screen very high. Start lexapro. Recommend she start by taking 1/2 tablet before bed. If tolerating well, may increase to 1 tablet.  - escitalopram (LEXAPRO) 5 MG tablet; Take 1 tablet (5 mg total) by mouth daily.  Dispense: 30 tablet; Refill: 2  3. Encounter for screening mammogram for malignant neoplasm of breast Screening mammogram ordered today. Patient understands to call center to make appointment . - MM 3D SCREEN BREAST BILATERAL; Future  4. Encounter to establish care Appointment today to establish new primary care provider    Problem List Items Addressed This Visit       Endocrine   Type 2 diabetes mellitus with hyperglycemia (HCC) - Primary   Relevant Medications   dapagliflozin propanediol (FARXIGA) 10 MG TABS tablet   insulin degludec (TRESIBA FLEXTOUCH) 100 UNIT/ML FlexTouch Pen     Other   Screening for breast cancer   Relevant Orders   MM 3D SCREEN BREAST  BILATERAL   Moderate episode of recurrent major depressive disorder (HCC)   Relevant Medications   escitalopram (LEXAPRO) 5 MG tablet   Other Visit Diagnoses     Encounter to establish care           Return in about 3 months (around 09/02/2022) for diabetes with HgbA1c check microalbumin. increased tresiba, restarted lexapro .   Carlean Jews, NP

## 2022-06-02 NOTE — Progress Notes (Deleted)
New Patient Office Visit  Subjective    Patient ID: Roland Prine, female    DOB: 26-Jan-1961  Age: 61 y.o. MRN: [id number]  CC:  Chief Complaint  Patient presents with   New Patient (Initial Visit)    HPI Lynix Bonine presents to establish care ***  Outpatient Encounter Medications as of 06/02/2022  Medication Sig   Accu-Chek FastClix Lancets MISC Use as directed to check sugars twice daily. Dx e11.65   ampicillin (PRINCIPEN) 500 MG capsule Take 1 capsule (500 mg total) by mouth 3 (three) times daily.   aspirin 81 MG chewable tablet Chew by mouth.   glucose blood (ACCU-CHEK GUIDE) test strip Use as instructed twice a day diag E11.65   Insulin Pen Needle (PEN NEEDLES) 32G X 4 MM MISC 1 each by Does not apply route daily. To use with insulin dosing up to three times daily as needed.  E11.9   omeprazole (PRILOSEC) 40 MG capsule TAKE 1 TABLET ONCE A DAY 30 MINUTES BEFORE MEAL   rosuvastatin (CRESTOR) 10 MG tablet Take 1 tablet (10 mg total) by mouth daily.   Vitamin D, Ergocalciferol, (DRISDOL) 1.25 MG (50000 UNIT) CAPS capsule TAKE ONE CAPSULE BY MOUTH WEEKLY FOR LOW VITAMIN D   [DISCONTINUED] dapagliflozin propanediol (FARXIGA) 10 MG TABS tablet Take 1 tablet (10 mg total) by mouth daily before breakfast.   [DISCONTINUED] escitalopram (LEXAPRO) 5 MG tablet Take 1 tablet (5 mg total) by mouth daily.   [DISCONTINUED] glimepiride (AMARYL) 1 MG tablet Take 1 tablet (1 mg total) by mouth daily with breakfast.   [DISCONTINUED] insulin degludec (TRESIBA FLEXTOUCH) 100 UNIT/ML FlexTouch Pen Inject 20 Units into the skin daily.   dapagliflozin propanediol (FARXIGA) 10 MG TABS tablet Take 1 tablet (10 mg total) by mouth daily before breakfast.   escitalopram (LEXAPRO) 5 MG tablet Take 1 tablet (5 mg total) by mouth daily.   insulin degludec (TRESIBA FLEXTOUCH) 100 UNIT/ML FlexTouch Pen Inject 25 Units into the skin daily.   [DISCONTINUED] Dulaglutide (TRULICITY) 0.75 MG/0.5ML  SOPN Inject 0.75 mg into the skin once a week. (Patient not taking: Reported on 06/02/2022)   No facility-administered encounter medications on file as of 06/02/2022.    Past Medical History:  Diagnosis Date   Diabetes mellitus without complication (HCC)    GERD (gastroesophageal reflux disease)    High cholesterol     History reviewed. No pertinent surgical history.  Family History  Problem Relation Age of Onset   Cancer Mother    Diabetes Mother    Alcohol abuse Son    Cancer Maternal Aunt    Breast cancer Neg Hx     Social History   Socioeconomic History   Marital status: Legally Separated    Spouse name: Not on file   Number of children: Not on file   Years of education: Not on file   Highest education level: Not on file  Occupational History   Not on file  Tobacco Use   Smoking status: Never   Smokeless tobacco: Current    Types: Snuff  Vaping Use   Vaping Use: Never used  Substance and Sexual Activity   Alcohol use: Not Currently   Drug use: Never   Sexual activity: Not Currently  Other Topics Concern   Not on file  Social History Narrative   Not on file   Social Determinants of Health   Financial Resource Strain: Not on file  Food Insecurity: Not on file  Transportation Needs: Not on  file  Physical Activity: Not on file  Stress: Not on file  Social Connections: Not on file  Intimate Partner Violence: Not on file    Review of Systems  Constitutional:  Positive for malaise/fatigue. Negative for chills and fever.  HENT:  Negative for congestion, sinus pain and sore throat.   Eyes: Negative.   Respiratory:  Negative for cough, shortness of breath and wheezing.   Cardiovascular:  Negative for chest pain, palpitations and leg swelling.  Gastrointestinal:  Negative for constipation, diarrhea, nausea and vomiting.  Genitourinary: Negative.   Musculoskeletal:  Negative for myalgias.  Skin: Negative.   Neurological:  Negative for dizziness and  headaches.  Endo/Heme/Allergies:  Does not bruise/bleed easily.       Very high blood sugars.   Psychiatric/Behavioral:  Positive for depression. The patient is not nervous/anxious.        Patient states that she will probably be depressed as long as she has to deal with her children.         Objective    BP (!) 90/59   Pulse 78   Temp 97.6 F (36.4 C)   Ht 5' 1.42" (1.56 m)   Wt 125 lb 1.9 oz (56.8 kg)   SpO2 97%   BMI 23.32 kg/m   Physical Exam Vitals and nursing note reviewed.  Constitutional:      Appearance: Normal appearance. She is well-developed.  HENT:     Head: Normocephalic and atraumatic.  Eyes:     Pupils: Pupils are equal, round, and reactive to light.  Cardiovascular:     Rate and Rhythm: Normal rate and regular rhythm.     Pulses: Normal pulses.     Heart sounds: Normal heart sounds.  Pulmonary:     Effort: Pulmonary effort is normal.     Breath sounds: Normal breath sounds.  Abdominal:     Palpations: Abdomen is soft.  Musculoskeletal:        General: Normal range of motion.     Cervical back: Normal range of motion and neck supple.  Lymphadenopathy:     Cervical: No cervical adenopathy.  Skin:    General: Skin is warm and dry.     Capillary Refill: Capillary refill takes less than 2 seconds.  Neurological:     General: No focal deficit present.     Mental Status: She is alert and oriented to person, place, and time.  Psychiatric:        Attention and Perception: Attention and perception normal.        Mood and Affect: Mood is depressed.        Speech: Speech normal.        Behavior: Behavior normal. Behavior is cooperative.        Thought Content: Thought content normal.        Cognition and Memory: Cognition and memory normal.        Judgment: Judgment normal.    Lab Results  Component Value Date   HGBA1C 11.6 (A) 04/16/2022        Assessment & Plan:  1. Type 2 diabetes mellitus with hyperglycemia, with long-term current use of  insulin (HCC) Most recent HgbA1c 11.6. patient having trouble tolerating glimepiride. Will discontinue this and trulicity. Increase Tresiba to 25 units daily. Continue Farxiga 10 mg daily. Monitor blood sugars closely. Goal is to have blood sugars between 70 and 120.  Check HgbA1c and urine microalbumin in three months.  - dapagliflozin propanediol (FARXIGA) 10 MG TABS tablet;  Take 1 tablet (10 mg total) by mouth daily before breakfast.  Dispense: 90 tablet; Refill: 1 - insulin degludec (TRESIBA FLEXTOUCH) 100 UNIT/ML FlexTouch Pen; Inject 25 Units into the skin daily.  Dispense: 3 mL; Refill: 1  2. Moderate episode of recurrent major depressive disorder (HCC) Depression screen very high. Start lexapro. Recommend she start by taking 1/2 tablet before bed. If tolerating well, may increase to 1 tablet.  - escitalopram (LEXAPRO) 5 MG tablet; Take 1 tablet (5 mg total) by mouth daily.  Dispense: 30 tablet; Refill: 2  3. Encounter for screening mammogram for malignant neoplasm of breast Screening mammogram ordered today. Patient understands to call center to make appointment . - MM 3D SCREEN BREAST BILATERAL; Future  4. Encounter to establish care Appointment today to establish new primary care provider    Problem List Items Addressed This Visit       Endocrine   Type 2 diabetes mellitus with hyperglycemia (HCC) - Primary   Relevant Medications   dapagliflozin propanediol (FARXIGA) 10 MG TABS tablet   insulin degludec (TRESIBA FLEXTOUCH) 100 UNIT/ML FlexTouch Pen     Other   Screening for breast cancer   Relevant Orders   MM 3D SCREEN BREAST BILATERAL   Moderate episode of recurrent major depressive disorder (HCC)   Relevant Medications   escitalopram (LEXAPRO) 5 MG tablet   Other Visit Diagnoses     Encounter to establish care           Return in about 3 months (around 09/02/2022) for diabetes with HgbA1c check microalbumin. increased tresiba, restarted lexapro .   Carlean Jews, NP

## 2022-08-03 ENCOUNTER — Inpatient Hospital Stay: Admission: RE | Admit: 2022-08-03 | Payer: No Typology Code available for payment source | Source: Ambulatory Visit

## 2022-08-31 NOTE — Progress Notes (Deleted)
Established patient visit   Patient: Kristin Cruz   DOB: 1961-05-17   61 y.o. Female  MRN: [id number] Visit Date: 09/01/2022   No chief complaint on file.  Subjective    HPI  Follow up  -insulin dependant diabetes  -recent HgbA1c done 04/16/2022 and was 11.6 -due for new HgbA1c -due for urine microalbumin  -needs referral for diabetic eye exam  -Tresiba increased to 25 units daily -on Farxiga 10 mg daily.  Mild major depression  -started lexapro 5 mg daily  -needs routine, fasting labs if agreeable.  -?flu vaccine  -?colon cancer screening    Medications: Outpatient Medications Prior to Visit  Medication Sig   Accu-Chek FastClix Lancets MISC Use as directed to check sugars twice daily. Dx e11.65   ampicillin (PRINCIPEN) 500 MG capsule Take 1 capsule (500 mg total) by mouth 3 (three) times daily.   aspirin 81 MG chewable tablet Chew by mouth.   dapagliflozin propanediol (FARXIGA) 10 MG TABS tablet Take 1 tablet (10 mg total) by mouth daily before breakfast.   escitalopram (LEXAPRO) 5 MG tablet Take 1 tablet (5 mg total) by mouth daily.   glucose blood (ACCU-CHEK GUIDE) test strip Use as instructed twice a day diag E11.65   insulin degludec (TRESIBA FLEXTOUCH) 100 UNIT/ML FlexTouch Pen Inject 25 Units into the skin daily.   Insulin Pen Needle (PEN NEEDLES) 32G X 4 MM MISC 1 each by Does not apply route daily. To use with insulin dosing up to three times daily as needed.  E11.9   omeprazole (PRILOSEC) 40 MG capsule TAKE 1 TABLET ONCE A DAY 30 MINUTES BEFORE MEAL   rosuvastatin (CRESTOR) 10 MG tablet Take 1 tablet (10 mg total) by mouth daily.   Vitamin D, Ergocalciferol, (DRISDOL) 1.25 MG (50000 UNIT) CAPS capsule TAKE ONE CAPSULE BY MOUTH WEEKLY FOR LOW VITAMIN D   No facility-administered medications prior to visit.    Review of Systems  {Labs (Optional):23779}   Objective    There were no vitals filed for this visit. There is no height or weight on file to  calculate BMI.  BP Readings from Last 3 Encounters:  06/02/22 (Abnormal) 90/59  04/16/22 108/68  07/31/21 97/69    Wt Readings from Last 3 Encounters:  06/02/22 125 lb 1.9 oz (56.8 kg)  04/16/22 127 lb 3.2 oz (57.7 kg)  07/31/21 122 lb 6.4 oz (55.5 kg)    Physical Exam  ***  No results found for any visits on 09/01/22.  Assessment & Plan     Problem List Items Addressed This Visit   None    No follow-ups on file.         Ronnell Freshwater, NP  [facility] at [facility] [phone] (phone) [phone] (fax)  [place]

## 2022-09-01 ENCOUNTER — Ambulatory Visit: Payer: No Typology Code available for payment source | Admitting: Nurse Practitioner

## 2022-09-29 ENCOUNTER — Encounter: Payer: Self-pay | Admitting: Nurse Practitioner

## 2022-09-29 ENCOUNTER — Ambulatory Visit (INDEPENDENT_AMBULATORY_CARE_PROVIDER_SITE_OTHER): Payer: No Typology Code available for payment source | Admitting: Nurse Practitioner

## 2022-09-29 VITALS — BP 110/68 | HR 88 | Ht 61.42 in | Wt 133.8 lb

## 2022-09-29 DIAGNOSIS — E785 Hyperlipidemia, unspecified: Secondary | ICD-10-CM | POA: Diagnosis not present

## 2022-09-29 DIAGNOSIS — E1169 Type 2 diabetes mellitus with other specified complication: Secondary | ICD-10-CM

## 2022-09-29 DIAGNOSIS — Z23 Encounter for immunization: Secondary | ICD-10-CM | POA: Diagnosis not present

## 2022-09-29 DIAGNOSIS — Z794 Long term (current) use of insulin: Secondary | ICD-10-CM | POA: Diagnosis not present

## 2022-09-29 DIAGNOSIS — E1165 Type 2 diabetes mellitus with hyperglycemia: Secondary | ICD-10-CM

## 2022-09-29 LAB — POCT UA - MICROALBUMIN
Creatinine, POC: 50 mg/dL
Microalbumin Ur, POC: 10 mg/L

## 2022-09-29 LAB — POCT GLYCOSYLATED HEMOGLOBIN (HGB A1C): HbA1c POC (<> result, manual entry): 10.6 % (ref 4.0–5.6)

## 2022-09-29 NOTE — Progress Notes (Signed)
Established patient visit   Patient: Kristin Cruz   DOB: 09/07/1961   61 y.o. Female  MRN: [id number] Visit Date: 09/29/2022   Chief Complaint  Patient presents with   Follow-up   Subjective    HPI  Follow up  -type 2 diabetes on long term insulin  -due for HgbA1c check  -still elevated at 10.1 but down from last check which was 11.6 -taking Tresiba 20 units, however, prescription written for 20 units. She takes farxiga 10 mg daily. Tolerates current medication doses well.  -urine microalbumin is abnormal. Does take Farxiga 10 mg to help with renal functions as well as blood sugars  -needs diabetic eye exam  -?colon cancer screening -declines flu shot.    Medications: Outpatient Medications Prior to Visit  Medication Sig   Accu-Chek FastClix Lancets MISC Use as directed to check sugars twice daily. Dx e11.65   ampicillin (PRINCIPEN) 500 MG capsule Take 1 capsule (500 mg total) by mouth 3 (three) times daily.   aspirin 81 MG chewable tablet Chew by mouth.   dapagliflozin propanediol (FARXIGA) 10 MG TABS tablet Take 1 tablet (10 mg total) by mouth daily before breakfast.   escitalopram (LEXAPRO) 5 MG tablet Take 1 tablet (5 mg total) by mouth daily.   glucose blood (ACCU-CHEK GUIDE) test strip Use as instructed twice a day diag E11.65   insulin degludec (TRESIBA FLEXTOUCH) 100 UNIT/ML FlexTouch Pen Inject 25 Units into the skin daily.   Insulin Pen Needle (PEN NEEDLES) 32G X 4 MM MISC 1 each by Does not apply route daily. To use with insulin dosing up to three times daily as needed.  E11.9   omeprazole (PRILOSEC) 40 MG capsule TAKE 1 TABLET ONCE A DAY 30 MINUTES BEFORE MEAL   rosuvastatin (CRESTOR) 10 MG tablet Take 1 tablet (10 mg total) by mouth daily.   Vitamin D, Ergocalciferol, (DRISDOL) 1.25 MG (50000 UNIT) CAPS capsule TAKE ONE CAPSULE BY MOUTH WEEKLY FOR LOW VITAMIN D   No facility-administered medications prior to visit.    Review of Systems  Constitutional:   Positive for fatigue. Negative for activity change, appetite change, chills and fever.  HENT:  Negative for congestion, postnasal drip, rhinorrhea, sinus pressure, sinus pain, sneezing and sore throat.   Eyes: Negative.   Respiratory:  Negative for cough, chest tightness, shortness of breath and wheezing.   Cardiovascular:  Negative for chest pain and palpitations.  Gastrointestinal:  Negative for abdominal pain, constipation, diarrhea, nausea and vomiting.  Endocrine: Negative for cold intolerance, heat intolerance, polydipsia and polyuria.       Elevated sugars. Not taking tresiba 20 units though should be 25 units.   Genitourinary:  Negative for dyspareunia, dysuria, flank pain, frequency and urgency.  Musculoskeletal:  Positive for myalgias. Negative for arthralgias and back pain.       Leg pain - right upper leg. At one point, was so severe, it was difficult to walk for a few days.   Skin:  Negative for rash.  Allergic/Immunologic: Negative for environmental allergies.  Neurological:  Negative for dizziness, weakness and headaches.  Hematological:  Negative for adenopathy.  Psychiatric/Behavioral:  The patient is not nervous/anxious.       Objective     Today's Vitals   09/29/22 1327  BP: 110/68  Pulse: 88  SpO2: 99%  Weight: 133 lb 12.8 oz (60.7 kg)  Height: 5' 1.42" (1.56 m)   Body mass index is 24.94 kg/m.  BP Readings from Last 3 Encounters:  09/29/22  110/68  06/02/22 (Abnormal) 90/59  04/16/22 108/68    Wt Readings from Last 3 Encounters:  09/29/22 133 lb 12.8 oz (60.7 kg)  06/02/22 125 lb 1.9 oz (56.8 kg)  04/16/22 127 lb 3.2 oz (57.7 kg)    Physical Exam Vitals and nursing note reviewed.  Constitutional:      Appearance: Normal appearance. She is well-developed.  HENT:     Head: Normocephalic and atraumatic.     Nose: Nose normal.     Mouth/Throat:     Mouth: Mucous membranes are moist.     Pharynx: Oropharynx is clear.  Eyes:     Extraocular  Movements: Extraocular movements intact.     Conjunctiva/sclera: Conjunctivae normal.     Pupils: Pupils are equal, round, and reactive to light.  Cardiovascular:     Rate and Rhythm: Normal rate and regular rhythm.     Pulses: Normal pulses.     Heart sounds: Normal heart sounds.  Pulmonary:     Effort: Pulmonary effort is normal.     Breath sounds: Normal breath sounds.  Abdominal:     Palpations: Abdomen is soft.  Musculoskeletal:        General: Normal range of motion.     Cervical back: Normal range of motion and neck supple.  Lymphadenopathy:     Cervical: No cervical adenopathy.  Skin:    General: Skin is warm and dry.     Capillary Refill: Capillary refill takes less than 2 seconds.  Neurological:     General: No focal deficit present.     Mental Status: She is alert and oriented to person, place, and time.  Psychiatric:        Mood and Affect: Mood normal.        Behavior: Behavior normal.        Thought Content: Thought content normal.        Judgment: Judgment normal.      Results for orders placed or performed in visit on 09/29/22  POCT UA - Microalbumin  Result Value Ref Range   Microalbumin Ur, POC 10 mg/L   Creatinine, POC 50 mg/dL   Albumin/Creatinine Ratio, Urine, POC 30-300   POCT glycosylated hemoglobin (Hb A1C)  Result Value Ref Range   Hemoglobin A1C     HbA1c POC (<> result, manual entry) 10.6 4.0 - 5.6 %   HbA1c, POC (prediabetic range)     HbA1c, POC (controlled diabetic range)      Assessment & Plan    1. Type 2 diabetes mellitus with hyperglycemia, with long-term current use of insulin (HCC) HgbA1c 10.1 today. Increase Tresiba to 25 units daily. Abnormal microalbumin. Currently on Farxiga. Refer for diabetic eye exam . - POCT UA - Microalbumin - POCT glycosylated hemoglobin (Hb A1C) - Ambulatory referral to Ophthalmology  2. Hyperlipidemia due to type 2 diabetes mellitus (HCC) Continue crestor as prescribed  - POCT UA - Microalbumin -  POCT glycosylated hemoglobin (Hb A1C)  3. Need for influenza vaccination Flu vaccine administered during today's visit.  - Flu Vaccine QUAD 6+ mos PF IM (Fluarix Quad PF)   Problem List Items Addressed This Visit       Endocrine   Hyperlipidemia due to type 2 diabetes mellitus (HCC)   Relevant Orders   POCT UA - Microalbumin (Completed)   POCT glycosylated hemoglobin (Hb A1C) (Completed)   Type 2 diabetes mellitus with hyperglycemia (HCC) - Primary   Relevant Orders   POCT UA - Microalbumin (Completed)  POCT glycosylated hemoglobin (Hb A1C) (Completed)   Ambulatory referral to Ophthalmology   Other Visit Diagnoses     Need for influenza vaccination       Relevant Orders   Flu Vaccine QUAD 6+ mos PF IM (Fluarix Quad PF) (Completed)        Return in about 3 months (around 12/30/2022) for health maintenance exam, with pap, check HgbA1c.         Carlean Jews, NP  [facility] at [facility] [phone] (phone) [phone] (fax)  [facility]

## 2023-01-05 ENCOUNTER — Encounter: Payer: Self-pay | Admitting: Nurse Practitioner

## 2023-01-05 ENCOUNTER — Ambulatory Visit (INDEPENDENT_AMBULATORY_CARE_PROVIDER_SITE_OTHER): Payer: No Typology Code available for payment source | Admitting: Nurse Practitioner

## 2023-01-05 VITALS — BP 107/71 | HR 87 | Ht 61.42 in | Wt 127.1 lb

## 2023-01-05 DIAGNOSIS — E038 Other specified hypothyroidism: Secondary | ICD-10-CM

## 2023-01-05 DIAGNOSIS — Z0001 Encounter for general adult medical examination with abnormal findings: Secondary | ICD-10-CM | POA: Diagnosis not present

## 2023-01-05 DIAGNOSIS — E1169 Type 2 diabetes mellitus with other specified complication: Secondary | ICD-10-CM

## 2023-01-05 DIAGNOSIS — E785 Hyperlipidemia, unspecified: Secondary | ICD-10-CM

## 2023-01-05 DIAGNOSIS — E559 Vitamin D deficiency, unspecified: Secondary | ICD-10-CM

## 2023-01-05 DIAGNOSIS — F331 Major depressive disorder, recurrent, moderate: Secondary | ICD-10-CM

## 2023-01-05 DIAGNOSIS — E1165 Type 2 diabetes mellitus with hyperglycemia: Secondary | ICD-10-CM

## 2023-01-05 DIAGNOSIS — Z794 Long term (current) use of insulin: Secondary | ICD-10-CM

## 2023-01-05 DIAGNOSIS — Z1211 Encounter for screening for malignant neoplasm of colon: Secondary | ICD-10-CM

## 2023-01-05 NOTE — Progress Notes (Signed)
Complete physical exam   Patient: Kristin Cruz   DOB: [birth year]   62 y.o. Female  MRN: [id number] Visit Date: 01/05/2023    Chief Complaint  Patient presents with   Annual Exam   Subjective    Kristin Cruz is a 62 y.o. female who presents today for a complete physical exam.  She reports consuming a  generally healthy  diet. The patient has a physically strenuous job, but has no regular exercise apart from work.  She generally feels fairly well. She does not have additional problems to discuss today.   HPI  Annual physical  -patient wishes to hold off on pap smear today  -blood sugars likely up --has been out of most medications for about a month.  --states that she has been unable to afford them until today  -she feels fatigued.  -has some situational stress regarding her family.  -depression scale is elevated today.     Row Labels 01/05/2023    9:36 AM 09/29/2022    1:29 PM 06/02/2022    8:52 AM  Depression screen PHQ 2/9   Section Header. No data exists in this row.     Decreased Interest   2 1 2   Down, Depressed, Hopeless   2 1 2   PHQ - 2 Score   4 2 4   Altered sleeping   2 1 1   Tired, decreased energy   2 1 2   Change in appetite   2 0 2  Feeling bad or failure about yourself    1 1 2   Trouble concentrating   2 1 2   Moving slowly or fidgety/restless   1 0 2  Suicidal thoughts   1 0 2  PHQ-9 Score   15 6 17      -she has no other physical concerns or complaints.  --She denies chest pain, chest pressure, or shortness of breath. She denies headaches or visual disturbances. She denies abdominal pain, nausea, vomiting, or changes in bowel or bladder habits.     Past Medical History:  Diagnosis Date   Diabetes mellitus without complication (HCC)    GERD (gastroesophageal reflux disease)    High cholesterol    History reviewed. No pertinent surgical history. Social History   Socioeconomic History   Marital status: Legally Separated    Spouse name:  Not on file   Number of children: Not on file   Years of education: Not on file   Highest education level: Not on file  Occupational History   Not on file  Tobacco Use   Smoking status: Never   Smokeless tobacco: Current    Types: Snuff  Vaping Use   Vaping Use: Never used  Substance and Sexual Activity   Alcohol use: Not Currently   Drug use: Never   Sexual activity: Not Currently  Other Topics Concern   Not on file  Social History Narrative   Not on file   Social Determinants of Health   Financial Resource Strain: Not on file  Food Insecurity: Not on file  Transportation Needs: Not on file  Physical Activity: Not on file  Stress: Not on file  Social Connections: Not on file  Intimate Partner Violence: Not on file   Family Status  Relation Name Status   Mother  Alive   Father  Deceased   Son  (Not Specified)   Mat Aunt  (Not Specified)   Neg Hx  (Not Specified)   Family History  Problem Relation Age of Onset  Cancer Mother    Diabetes Mother    Alcohol abuse Son    Cancer Maternal Aunt    Breast cancer Neg Hx    No Known Allergies  Patient Care Team: Ronnell Freshwater, NP as PCP - General (Family Medicine)   Medications: Outpatient Medications Prior to Visit  Medication Sig   Accu-Chek FastClix Lancets MISC Use as directed to check sugars twice daily. Dx e11.65   aspirin 81 MG chewable tablet Chew by mouth.   dapagliflozin propanediol (FARXIGA) 10 MG TABS tablet Take 1 tablet (10 mg total) by mouth daily before breakfast.   glucose blood (ACCU-CHEK GUIDE) test strip Use as instructed twice a day diag E11.65   insulin degludec (TRESIBA FLEXTOUCH) 100 UNIT/ML FlexTouch Pen Inject 25 Units into the skin daily.   Insulin Pen Needle (PEN NEEDLES) 32G X 4 MM MISC 1 each by Does not apply route daily. To use with insulin dosing up to three times daily as needed.  E11.9   omeprazole (PRILOSEC) 40 MG capsule TAKE 1 TABLET ONCE A DAY 30 MINUTES BEFORE MEAL    rosuvastatin (CRESTOR) 10 MG tablet Take 1 tablet (10 mg total) by mouth daily.   Vitamin D, Ergocalciferol, (DRISDOL) 1.25 MG (50000 UNIT) CAPS capsule TAKE ONE CAPSULE BY MOUTH WEEKLY FOR LOW VITAMIN D   escitalopram (LEXAPRO) 5 MG tablet Take 1 tablet (5 mg total) by mouth daily. (Patient not taking: Reported on 01/05/2023)   [DISCONTINUED] ampicillin (PRINCIPEN) 500 MG capsule Take 1 capsule (500 mg total) by mouth 3 (three) times daily.   No facility-administered medications prior to visit.    Review of Systems See HPI    Last CBC Lab Results  Component Value Date   WBC 3.5 01/05/2023   HGB 11.7 01/05/2023   HCT 35.6 01/05/2023   MCV 89 01/05/2023   MCH 29.4 01/05/2023   RDW 10.8 (L) 01/05/2023   PLT 175 [id number]   Last metabolic panel Lab Results  Component Value Date   GLUCOSE 473 (H) 01/05/2023   NA 135 01/05/2023   K 4.5 01/05/2023   CL 99 01/05/2023   CO2 23 01/05/2023   BUN 20 01/05/2023   CREATININE 0.75 01/05/2023   EGFR 89 01/05/2023   CALCIUM 9.1 01/05/2023   PROT 6.9 01/05/2023   ALBUMIN 4.3 01/05/2023   LABGLOB 2.6 01/05/2023   AGRATIO 1.7 01/05/2023   BILITOT 0.7 01/05/2023   ALKPHOS 165 (H) 01/05/2023   AST 22 01/05/2023   ALT 23 01/05/2023   ANIONGAP 13 05/27/2018   Last lipids Lab Results  Component Value Date   CHOL 206 (H) 01/05/2023   HDL 67 01/05/2023   LDLCALC 117 (H) 01/05/2023   TRIG 123 01/05/2023   CHOLHDL 3.1 01/05/2023   Last hemoglobin A1c Lab Results  Component Value Date   HGBA1C 11.4 (H) 01/05/2023   Last thyroid functions Lab Results  Component Value Date   TSH 1.790 01/05/2023   Last vitamin D Lab Results  Component Value Date   VD25OH 15.7 (L) 01/05/2023       Objective     Today's Vitals   01/05/23 0905  BP: 107/71  Pulse: 87  SpO2: 99%  Weight: 127 lb 1.9 oz (57.7 kg)  Height: 5' 1.42" (1.56 m)   Body mass index is 23.69 kg/m.  BP Readings from Last 3 Encounters:  01/05/23 107/71  09/29/22  110/68  06/02/22 (Abnormal) 90/59    Wt Readings from Last 3 Encounters:  01/05/23 127 lb  1.9 oz (57.7 kg)  09/29/22 133 lb 12.8 oz (60.7 kg)  06/02/22 125 lb 1.9 oz (56.8 kg)     Physical Exam Vitals and nursing note reviewed.  Constitutional:      Appearance: Normal appearance. She is well-developed.  HENT:     Head: Normocephalic and atraumatic.     Right Ear: Tympanic membrane, ear canal and external ear normal.     Left Ear: Tympanic membrane, ear canal and external ear normal.     Nose: Nose normal.     Mouth/Throat:     Mouth: Mucous membranes are moist.     Pharynx: Oropharynx is clear.  Eyes:     Extraocular Movements: Extraocular movements intact.     Conjunctiva/sclera: Conjunctivae normal.     Pupils: Pupils are equal, round, and reactive to light.  Neck:     Vascular: No carotid bruit.  Cardiovascular:     Rate and Rhythm: Normal rate and regular rhythm.     Pulses: Normal pulses.     Heart sounds: Normal heart sounds.  Pulmonary:     Effort: Pulmonary effort is normal.     Breath sounds: Normal breath sounds.  Abdominal:     General: Bowel sounds are normal. There is no distension.     Palpations: Abdomen is soft. There is no mass.     Tenderness: There is no abdominal tenderness. There is no right CVA tenderness, left CVA tenderness, guarding or rebound.     Hernia: No hernia is present.  Musculoskeletal:        General: Normal range of motion.     Cervical back: Normal range of motion and neck supple.  Lymphadenopathy:     Cervical: No cervical adenopathy.  Skin:    General: Skin is warm and dry.     Capillary Refill: Capillary refill takes less than 2 seconds.  Neurological:     General: No focal deficit present.     Mental Status: She is alert and oriented to person, place, and time.  Psychiatric:        Attention and Perception: Attention and perception normal.        Mood and Affect: Affect normal. Mood is anxious.        Speech: Speech normal.         Behavior: Behavior normal. Behavior is cooperative.        Thought Content: Thought content normal.        Cognition and Memory: Cognition and memory normal.        Judgment: Judgment normal.     Last depression screening scores   Row Labels 01/05/2023    9:36 AM 09/29/2022    1:29 PM 06/02/2022    8:52 AM  PHQ 2/9 Scores   Section Header. No data exists in this row.     PHQ - 2 Score   4 2 4   PHQ- 9 Score   15 6 17    Last fall risk screening   Row Labels 01/05/2023    9:38 AM  Fall Risk    Section Header. No data exists in this row.   Falls in the past year?   0  Number falls in past yr:   0  Injury with Fall?   0  Follow up   Falls evaluation completed   Last Audit-C alcohol use screening   Row Labels 04/16/2022    9:52 AM  Alcohol Use Disorder Test (AUDIT)   Section Header. No data exists in  this row.   1. How often do you have a drink containing alcohol?   0  2. How many drinks containing alcohol do you have on a typical day when you are drinking?   0  3. How often do you have six or more drinks on one occasion?   0  AUDIT-C Score   0   A score of 3 or more in women, and 4 or more in men indicates increased risk for alcohol abuse, EXCEPT if all of the points are from question 1   Results for orders placed or performed in visit on 01/05/23  CBC  Result Value Ref Range   WBC 3.5 3.4 - 10.8 x10E3/uL   RBC 3.98 3.77 - 5.28 x10E6/uL   Hemoglobin 11.7 11.1 - 15.9 g/dL   Hematocrit 35.6 34.0 - 46.6 %   MCV 89 79 - 97 fL   MCH 29.4 26.6 - 33.0 pg   MCHC 32.9 31.5 - 35.7 g/dL   RDW 10.8 (L) 11.7 - 15.4 %   Platelets 175 150 - 450 x10E3/uL  Comprehensive metabolic panel  Result Value Ref Range   Glucose 473 (H) 70 - 99 mg/dL   BUN 20 8 - 27 mg/dL   Creatinine, Ser 0.75 0.57 - 1.00 mg/dL   eGFR 89 >59 mL/min/1.73   BUN/Creatinine Ratio 27 12 - 28   Sodium 135 134 - 144 mmol/L   Potassium 4.5 3.5 - 5.2 mmol/L   Chloride 99 96 - 106 mmol/L   CO2 23 20 - 29  mmol/L   Calcium 9.1 8.7 - 10.3 mg/dL   Total Protein 6.9 6.0 - 8.5 g/dL   Albumin 4.3 3.9 - 4.9 g/dL   Globulin, Total 2.6 1.5 - 4.5 g/dL   Albumin/Globulin Ratio 1.7 1.2 - 2.2   Bilirubin Total 0.7 0.0 - 1.2 mg/dL   Alkaline Phosphatase 165 (H) 44 - 121 IU/L   AST 22 0 - 40 IU/L   ALT 23 0 - 32 IU/L  Lipid panel  Result Value Ref Range   Cholesterol, Total 206 (H) 100 - 199 mg/dL   Triglycerides 123 0 - 149 mg/dL   HDL 67 >39 mg/dL   VLDL Cholesterol Cal 22 5 - 40 mg/dL   LDL Chol Calc (NIH) 117 (H) 0 - 99 mg/dL   Chol/HDL Ratio 3.1 0.0 - 4.4 ratio  Hemoglobin A1c  Result Value Ref Range   Hgb A1c MFr Bld 11.4 (H) 4.8 - 5.6 %   Est. average glucose Bld gHb Est-mCnc 280 mg/dL  VITAMIN D 25 Hydroxy (Vit-D Deficiency, Fractures)  Result Value Ref Range   Vit D, 25-Hydroxy 15.7 (L) 30.0 - 100.0 ng/mL  TSH + free T4  Result Value Ref Range   TSH 1.790 0.450 - 4.500 uIU/mL   Free T4 1.25 0.82 - 1.77 ng/dL    Assessment & Plan    Encounter for general adult medical examination with abnormal findings Assessment & Plan: Annual physical today    Type 2 diabetes mellitus with hyperglycemia, with long-term current use of insulin ([place]) Assessment & Plan: HgbA1c drawn with labs today. Likely elevated as she has been off her insulin for over a month. Restart  Tresiba 25 units daily. Limit intake of carbohydrates and sugar and increase water in the diet. Recheck HgbA1c in 3 months.   Orders: -     Hemoglobin A1c; Future -     Comprehensive metabolic panel; Future -     CBC; Future  Hyperlipidemia due to type 2 diabetes mellitus ([place]) Assessment & Plan: Check fastin lipid panel today and adjust dosing crestor as indicated   Orders: -     Lipid panel; Future -     Comprehensive metabolic panel; Future -     CBC; Future  Subclinical hypothyroidism -     TSH + free T4; Future  Vitamin D deficiency Assessment & Plan: Check vitamin d level and treat deficiency as indicated.     Orders: -     VITAMIN D 25 Hydroxy (Vit-D Deficiency, Fractures); Future  Moderate episode of recurrent major depressive disorder ([place]) Assessment & Plan: Patient has been on low dose lexapro 5 mg in the  past. Currently not taking. Does not wish to restart this medication or start new medication at this time. Reassess in 3 months.    Screening for colon cancer -     Ambulatory referral to Gastroenterology      Immunization History  Administered Date(s) Administered   Influenza,inj,Quad PF,6+ Mos 09/29/2022   Moderna Sars-Covid-2 Vaccination 06/28/2020, 07/26/2020    Health Maintenance  Topic Date Due   HIV Screening  Never done   Hepatitis C Screening  Never done   DTaP/Tdap/Td (1 - Tdap) Never done   Zoster Vaccines- Shingrix (1 of 2) Never done   COLONOSCOPY (Pts 45-24yrs Insurance coverage will need to be confirmed)  Never done   OPHTHALMOLOGY EXAM  09/29/2019   COVID-19 Vaccine (3 - Moderna risk series) 08/23/2020   FOOT EXAM  11/19/2020   PAP SMEAR-Modifier  11/19/2022   MAMMOGRAM  05/27/2023   HEMOGLOBIN A1C  07/06/2023   Diabetic kidney evaluation - Urine ACR  09/30/2023   Diabetic kidney evaluation - eGFR measurement  01/06/2024   INFLUENZA VACCINE  Completed   HPV VACCINES  Aged Out    Discussed health benefits of physical activity, and encouraged her to engage in regular exercise appropriate for her age and condition.  Problem List Items Addressed This Visit       Endocrine   Hyperlipidemia due to type 2 diabetes mellitus ([place])    Check fastin lipid panel today and adjust dosing crestor as indicated       Relevant Orders   Lipid panel (Completed)   Comprehensive metabolic panel (Completed)   CBC (Completed)   Type 2 diabetes mellitus with hyperglycemia (HCC)    HgbA1c drawn with labs today. Likely elevated as she has been off her insulin for over a month. Restart  Tresiba 25 units daily. Limit intake of carbohydrates and sugar and increase water in  the diet. Recheck HgbA1c in 3 months.       Relevant Orders   Hemoglobin A1c (Completed)   Comprehensive metabolic panel (Completed)   CBC (Completed)   Subclinical hypothyroidism   Relevant Orders   TSH + free T4 (Completed)     Other   Vitamin D deficiency    Check vitamin d level and treat deficiency as indicated.        Relevant Orders   VITAMIN D 25 Hydroxy (Vit-D Deficiency, Fractures) (Completed)   Encounter for general adult medical examination with abnormal findings - Primary    Annual physical today       Moderate episode of recurrent major depressive disorder ([place])    Patient has been on low dose lexapro 5 mg in the  past. Currently not taking. Does not wish to restart this medication or start new medication at this time. Reassess in 3 months.  Other Visit Diagnoses     Screening for colon cancer       Relevant Orders   Ambulatory referral to Gastroenterology        Return in about 3 months (around 04/05/2023) for diabetes with HgbA1c check.        Ronnell Freshwater, NP  [facility] [phone] (phone) [phone] (fax)  [place]

## 2023-01-06 LAB — LIPID PANEL
Chol/HDL Ratio: 3.1 ratio (ref 0.0–4.4)
Cholesterol, Total: 206 mg/dL — ABNORMAL HIGH (ref 100–199)
HDL: 67 mg/dL (ref >39)
LDL Chol Calc (NIH): 117 mg/dL — ABNORMAL HIGH (ref 0–99)
Triglycerides: 123 mg/dL (ref 0–149)
VLDL Cholesterol Cal: 22 mg/dL (ref 5–40)

## 2023-01-06 LAB — COMPREHENSIVE METABOLIC PANEL
ALT: 23 IU/L (ref 0–32)
AST: 22 IU/L (ref 0–40)
Albumin/Globulin Ratio: 1.7 (ref 1.2–2.2)
Albumin: 4.3 g/dL (ref 3.9–4.9)
Alkaline Phosphatase: 165 IU/L — ABNORMAL HIGH (ref 44–121)
BUN/Creatinine Ratio: 27 (ref 12–28)
BUN: 20 mg/dL (ref 8–27)
Bilirubin Total: 0.7 mg/dL (ref 0.0–1.2)
CO2: 23 mmol/L (ref 20–29)
Calcium: 9.1 mg/dL (ref 8.7–10.3)
Chloride: 99 mmol/L (ref 96–106)
Creatinine, Ser: 0.75 mg/dL (ref 0.57–1.00)
Globulin, Total: 2.6 g/dL (ref 1.5–4.5)
Glucose: 473 mg/dL — ABNORMAL HIGH (ref 70–99)
Potassium: 4.5 mmol/L (ref 3.5–5.2)
Sodium: 135 mmol/L (ref 134–144)
Total Protein: 6.9 g/dL (ref 6.0–8.5)
eGFR: 89 mL/min/{1.73_m2} (ref >59)

## 2023-01-06 LAB — CBC
Hematocrit: 35.6 % (ref 34.0–46.6)
Hemoglobin: 11.7 g/dL (ref 11.1–15.9)
MCH: 29.4 pg (ref 26.6–33.0)
MCHC: 32.9 g/dL (ref 31.5–35.7)
MCV: 89 fL (ref 79–97)
Platelets: 175 10*3/uL (ref 150–450)
RBC: 3.98 x10E6/uL (ref 3.77–5.28)
RDW: 10.8 % — ABNORMAL LOW (ref 11.7–15.4)
WBC: 3.5 10*3/uL (ref 3.4–10.8)

## 2023-01-06 LAB — TSH+FREE T4
Free T4: 1.25 ng/dL (ref 0.82–1.77)
TSH: 1.79 u[IU]/mL (ref 0.450–4.500)

## 2023-01-06 LAB — VITAMIN D 25 HYDROXY (VIT D DEFICIENCY, FRACTURES): Vit D, 25-Hydroxy: 15.7 ng/mL — ABNORMAL LOW (ref 30.0–100.0)

## 2023-01-06 LAB — HEMOGLOBIN A1C
Est. average glucose Bld gHb Est-mCnc: 280 mg/dL
Hgb A1c MFr Bld: 11.4 % — ABNORMAL HIGH (ref 4.8–5.6)

## 2023-01-09 ENCOUNTER — Other Ambulatory Visit: Payer: Self-pay | Admitting: Physician Assistant

## 2023-01-14 ENCOUNTER — Encounter: Payer: Self-pay | Admitting: *Deleted

## 2023-02-05 DIAGNOSIS — E038 Other specified hypothyroidism: Secondary | ICD-10-CM | POA: Insufficient documentation

## 2023-02-05 NOTE — Assessment & Plan Note (Signed)
>>  ASSESSMENT AND PLAN FOR TYPE 2 DIABETES MELLITUS WITH HYPERGLYCEMIA (HCC) WRITTEN ON 02/05/2023  6:31 PM BY BOSCIA, HEATHER E, NP  HgbA1c drawn with labs today. Likely elevated as she has been off her insulin for over a month. Restart  Tresiba 25 units daily. Limit intake of carbohydrates and sugar and increase water in the diet. Recheck HgbA1c in 3 months.

## 2023-02-05 NOTE — Assessment & Plan Note (Deleted)
HgbA1c drawn with labs today. Likely elevated as she has been off her insulin for over a month. Restart  Tresiba 25 units daily. Limit intake of carbohydrates and sugar and increase water in the diet. Recheck HgbA1c in 3 months.

## 2023-02-05 NOTE — Assessment & Plan Note (Signed)
Check fastin lipid panel today and adjust dosing crestor as indicated

## 2023-02-05 NOTE — Assessment & Plan Note (Signed)
Annual physical today

## 2023-02-05 NOTE — Assessment & Plan Note (Signed)
Check vitamin d level and treat deficiency as indicated.

## 2023-02-05 NOTE — Assessment & Plan Note (Signed)
Patient has been on low dose lexapro 5 mg in the  past. Currently not taking. Does not wish to restart this medication or start new medication at this time. Reassess in 3 months.

## 2023-02-05 NOTE — Assessment & Plan Note (Signed)
HgbA1c drawn with labs today. Likely elevated as she has been off her insulin for over a month. Restart  Tresiba 25 units daily. Limit intake of carbohydrates and sugar and increase water in the diet. Recheck HgbA1c in 3 months.

## 2023-02-22 ENCOUNTER — Encounter: Payer: Self-pay | Admitting: Nurse Practitioner

## 2023-02-27 ENCOUNTER — Ambulatory Visit
Admission: EM | Admit: 2023-02-27 | Discharge: 2023-02-27 | Disposition: A | Discharge status: Home / Self Care | Payer: Self-pay | Attending: Urgent Care | Admitting: Urgent Care

## 2023-02-27 DIAGNOSIS — R058 Other specified cough: Secondary | ICD-10-CM

## 2023-02-27 DIAGNOSIS — R197 Diarrhea, unspecified: Secondary | ICD-10-CM

## 2023-02-27 MED ORDER — BENZONATATE 100 MG PO CAPS: ORAL_CAPSULE | ORAL | 0 refills | Status: DC

## 2023-02-27 MED ORDER — CIPROFLOXACIN HCL 500 MG PO TABS: 500.0000 mg | ORAL_TABLET | Freq: Two times a day (BID) | ORAL | 0 refills | Status: AC

## 2023-02-27 NOTE — ED Notes (Signed)
Provider triaged.  

## 2023-02-27 NOTE — ED Provider Notes (Addendum)
Kristin Cruz    CSN: [id number] Arrival date & time: 02/27/23  1346      History   Chief Complaint No chief complaint on file.   HPI Kristin Cruz is a 62 y.o. female.   HPI  Presents to urgent care with complaint of cough times 1+ weeks accompanied by vomiting and diarrhea.  She states last episode of vomiting was yesterday and she has difficulty holding down anything but water.  She likes to drink sugared soda which is not tolerable given her current symptoms.  She does say that each time she eats or drinks she has an episode of liquidy diarrhea.  She denies fever.  Denies dizziness.  PMH includes DM2, poorly controlled per recent A1c.  She denies foreign travel.  Denies change in diet.  Prepares her own food, no restaurant or food truck food or other suspicious sources.  Past Medical History:  Diagnosis Date   Diabetes mellitus without complication (HCC)    GERD (gastroesophageal reflux disease)    High cholesterol     Patient Active Problem List   Diagnosis Date Noted   Subclinical hypothyroidism 02/05/2023   Moderate episode of recurrent major depressive disorder 06/02/2022   Hypertrophic toenail 07/15/2020   Nonintractable headache 02/18/2020   Encounter for general adult medical examination with abnormal findings 11/24/2019   Routine cervical smear 11/24/2019   Dysuria 11/24/2019   Pain of left shoulder region 08/26/2019   Type 2 diabetes mellitus with hyperglycemia 07/18/2019   Screening for breast cancer 07/18/2019   Gastroesophageal reflux disease without esophagitis 07/18/2019   Mixed hyperlipidemia 07/18/2019   Hyperlipidemia due to type 2 diabetes mellitus 12/29/2017   Type 2 diabetes mellitus with diabetic polyneuropathy, with long-term current use of insulin 12/29/2017   Vitamin D deficiency 12/29/2017    No past surgical history on file.  OB History   No obstetric history on file.      Home Medications    Prior to  Admission medications   Medication Sig Start Date End Date Taking? Authorizing Provider  Accu-Chek FastClix Lancets MISC Use as directed to check sugars twice daily. Dx e11.65 04/16/22   Carlean Jews, PA-C  aspirin 81 MG chewable tablet Chew by mouth.    [provider]  dapagliflozin propanediol (FARXIGA) 10 MG TABS tablet Take 1 tablet (10 mg total) by mouth daily before breakfast. 06/02/22   Carlean Jews, NP  escitalopram (LEXAPRO) 5 MG tablet Take 1 tablet (5 mg total) by mouth daily. Patient not taking: Reported on 01/05/2023 06/02/22   Carlean Jews, NP  glucose blood (ACCU-CHEK GUIDE) test strip Use as instructed twice a day diag E11.65 04/16/22   McDonough, Salomon Fick, PA-C  insulin degludec (TRESIBA FLEXTOUCH) 100 UNIT/ML FlexTouch Pen Inject 25 Units into the skin daily. 06/02/22   Carlean Jews, NP  Insulin Pen Needle (PEN NEEDLES) 32G X 4 MM MISC 1 each by Does not apply route daily. To use with insulin dosing up to three times daily as needed.  E11.9 04/16/22   McDonough, Salomon Fick, PA-C  omeprazole (PRILOSEC) 40 MG capsule TAKE 1 TABLET ONCE A DAY 30 MINUTES BEFORE MEAL 04/30/21   McDonough, Lauren K, PA-C  rosuvastatin (CRESTOR) 10 MG tablet Take 1 tablet (10 mg total) by mouth daily. 04/16/22   McDonough, Salomon Fick, PA-C  Vitamin D, Ergocalciferol, (DRISDOL) 1.25 MG (50000 UNIT) CAPS capsule TAKE ONE CAPSULE BY MOUTH WEEKLY FOR LOW VITAMIN D 04/30/21   Lynn Ito  K, PA-C    Family History Family History  Problem Relation Age of Onset   Cancer Mother    Diabetes Mother    Alcohol abuse Son    Cancer Maternal Aunt    Breast cancer Neg Hx     Social History Social History   Tobacco Use   Smoking status: Never   Smokeless tobacco: Current    Types: Snuff  Vaping Use   Vaping Use: Never used  Substance Use Topics   Alcohol use: Not Currently   Drug use: Never     Allergies   Patient has no known allergies.   Review of Systems Review of  Systems   Physical Exam Triage Vital Signs ED Triage Vitals [02/27/23 1358]  Enc Vitals Group     BP 105/69     Pulse Rate 94     Resp 16     Temp 97.8 F (36.6 C)     Temp Source Oral     SpO2 97 %     Weight      Height      Head Circumference      Peak Flow      Pain Score      Pain Loc      Pain Edu?      Excl. in GC?    No data found.  Updated Vital Signs BP 105/69 (BP Location: Left Arm)   Pulse 94   Temp 97.8 F (36.6 C) (Oral)   Resp 16   SpO2 97%   Visual Acuity Right Eye Distance:   Left Eye Distance:   Bilateral Distance:    Right Eye Near:   Left Eye Near:    Bilateral Near:     Physical Exam Vitals reviewed.  Constitutional:      Appearance: She is ill-appearing.  Cardiovascular:     Rate and Rhythm: Normal rate and regular rhythm.     Pulses: Normal pulses.     Heart sounds: Normal heart sounds.  Pulmonary:     Effort: Pulmonary effort is normal.     Breath sounds: Normal breath sounds.  Abdominal:     General: Bowel sounds are increased.     Palpations: Abdomen is soft.     Tenderness: There is no abdominal tenderness. Negative signs include Murphy's sign and McBurney's sign.  Skin:    General: Skin is warm and dry.  Neurological:     General: No focal deficit present.     Mental Status: She is alert and oriented to person, place, and time.  Psychiatric:        Mood and Affect: Mood normal.        Behavior: Behavior normal.      UC Treatments / Results  Labs (all labs ordered are listed, but only abnormal results are displayed) Labs Reviewed - No data to display  EKG   Radiology No results found.  Procedures Procedures (including critical care time)  Medications Ordered in UC Medications - No data to display  Initial Impression / Assessment and Plan / UC Course  I have reviewed the triage vital signs and the nursing notes.  Pertinent labs & imaging results that were available during my care of the patient were  reviewed by me and considered in my medical decision making (see chart for details).   Kristin Cruz is a 62 y.o. female presenting with coughing and diarrhea. Patient is afebrile without recent antipyretics, satting well on room air. Overall is ill  appearing though non-toxic, well hydrated, without respiratory distress. Pulmonary exam is unremarkable.  Lungs CTAB without wheezing, rhonchi, rales.  Abdomen is soft and nontender though she complains of "cramping".  Bowel sounds are hyperactive.  Patient's symptoms are consistent with an acute viral process.  Lungs are clear and suspect cough is post viral.  Will treat with benzonatate.  Somewhat concerned about her ongoing diarrhea in the context of poorly treated DM 2.  She does not use metformin or other medications that typically result in GI issues.  While I think the likelihood of a bacterial allergy is remote, I will treat her symptoms with a few days of an antibiotic and have instructed her to purchase Imodium from the pharmacy and follow the administration directions.  She is tolerating water other than diarrhea and I do not have current concern for dehydration.  Patient will follow-up with her primary care provider ASAP.  Counseled patient on potential for adverse effects with medications prescribed/recommended today, ER and return-to-clinic precautions discussed, patient verbalized understanding and agreement with care plan.    Final Clinical Impressions(s) / UC Diagnoses   Final diagnoses:  None   Discharge Instructions   None    ED Prescriptions   None    PDMP not reviewed this encounter.   Charma Igo, FNP 02/27/23 1417    ImmordinoJeannett Senior, FNP 02/27/23 1420

## 2023-02-27 NOTE — Discharge Instructions (Addendum)
I have prescribed an antibiotic to treat your diarrhea out of abundance of caution.  Please take this prescribed medication as directed.  Drink water and other electrolyte replacement beverages such as broth.  Avoid drinking sugared beverages such as soft drinks.  Also recommend you obtain over-the-counter Imodium from the pharmacy.  Follow the packaging directions to avoid taking too much of this medication and causing unintentional side effects.  I have prescribed a cough medication.  You may take 1 or 2 capsules every 8 hours to control your cough.  Please schedule an appointment as soon as possible for follow-up with your primary care provider.

## 2023-02-28 ENCOUNTER — Encounter: Payer: Self-pay | Admitting: Nurse Practitioner

## 2023-03-07 ENCOUNTER — Encounter: Payer: Self-pay | Admitting: *Deleted

## 2023-03-08 ENCOUNTER — Other Ambulatory Visit: Payer: Self-pay

## 2023-03-08 ENCOUNTER — Ambulatory Visit
Admission: EM | Admit: 2023-03-08 | Discharge: 2023-03-08 | Disposition: A | Discharge status: Home / Self Care | Payer: PRIVATE HEALTH INSURANCE | Attending: Family | Admitting: Family

## 2023-03-08 ENCOUNTER — Ambulatory Visit (INDEPENDENT_AMBULATORY_CARE_PROVIDER_SITE_OTHER): Payer: PRIVATE HEALTH INSURANCE

## 2023-03-08 DIAGNOSIS — R739 Hyperglycemia, unspecified: Secondary | ICD-10-CM | POA: Diagnosis not present

## 2023-03-08 DIAGNOSIS — R81 Glycosuria: Secondary | ICD-10-CM

## 2023-03-08 DIAGNOSIS — Z8679 Personal history of other diseases of the circulatory system: Secondary | ICD-10-CM | POA: Insufficient documentation

## 2023-03-08 DIAGNOSIS — J0110 Acute frontal sinusitis, unspecified: Secondary | ICD-10-CM | POA: Diagnosis not present

## 2023-03-08 DIAGNOSIS — R051 Acute cough: Secondary | ICD-10-CM | POA: Diagnosis not present

## 2023-03-08 DIAGNOSIS — D649 Anemia, unspecified: Secondary | ICD-10-CM

## 2023-03-08 LAB — CBC WITH DIFFERENTIAL/PLATELET
Abs Immature Granulocytes: 0.02 10*3/uL (ref 0.00–0.07)
Basophils Absolute: 0 10*3/uL (ref 0.0–0.1)
Basophils Relative: 1 %
Eosinophils Absolute: 0.1 10*3/uL (ref 0.0–0.5)
Eosinophils Relative: 1 %
HCT: 33 % — ABNORMAL LOW (ref 36.0–46.0)
Hemoglobin: 11.3 g/dL — ABNORMAL LOW (ref 12.0–15.0)
Immature Granulocytes: 0 %
Lymphocytes Relative: 18 %
Lymphs Abs: 1.1 10*3/uL (ref 0.7–4.0)
MCH: 30.1 pg (ref 26.0–34.0)
MCHC: 34.2 g/dL (ref 30.0–36.0)
MCV: 87.8 fL (ref 80.0–100.0)
Monocytes Absolute: 0.4 10*3/uL (ref 0.1–1.0)
Monocytes Relative: 6 %
Neutro Abs: 4.8 10*3/uL (ref 1.7–7.7)
Neutrophils Relative %: 74 %
Platelets: 213 10*3/uL (ref 150–400)
RBC: 3.76 MIL/uL — ABNORMAL LOW (ref 3.87–5.11)
RDW: 11.8 % (ref 11.5–15.5)
WBC: 6.4 10*3/uL (ref 4.0–10.5)
nRBC: 0 % (ref 0.0–0.2)

## 2023-03-08 LAB — COMPREHENSIVE METABOLIC PANEL
ALT: 17 U/L (ref 0–44)
AST: 24 U/L (ref 15–41)
Albumin: 3.6 g/dL (ref 3.5–5.0)
Alkaline Phosphatase: 86 U/L (ref 38–126)
Anion gap: 4 — ABNORMAL LOW (ref 5–15)
BUN: 11 mg/dL (ref 8–23)
CO2: 28 mmol/L (ref 22–32)
Calcium: 8.6 mg/dL — ABNORMAL LOW (ref 8.9–10.3)
Chloride: 101 mmol/L (ref 98–111)
Creatinine, Ser: 0.65 mg/dL (ref 0.44–1.00)
GFR, Estimated: >60 mL/min (ref >60)
Glucose, Bld: 346 mg/dL — ABNORMAL HIGH (ref 70–99)
Potassium: 3.5 mmol/L (ref 3.5–5.1)
Sodium: 133 mmol/L — ABNORMAL LOW (ref 135–145)
Total Bilirubin: 1.8 mg/dL — ABNORMAL HIGH (ref 0.3–1.2)
Total Protein: 7 g/dL (ref 6.5–8.1)

## 2023-03-08 LAB — URINALYSIS, ROUTINE W REFLEX MICROSCOPIC
Bilirubin Urine: NEGATIVE
Glucose, UA: >1000 mg/dL — AB
Hgb urine dipstick: NEGATIVE
Ketones, ur: NEGATIVE mg/dL
Leukocytes,Ua: NEGATIVE
Nitrite: NEGATIVE
Protein, ur: NEGATIVE mg/dL
Specific Gravity, Urine: 1.01 (ref 1.005–1.030)
pH: 6 (ref 5.0–8.0)

## 2023-03-08 MED ORDER — AMOXICILLIN-POT CLAVULANATE 875-125 MG PO TABS: 1.0000 | ORAL_TABLET | Freq: Two times a day (BID) | ORAL | 0 refills | Status: AC

## 2023-03-08 NOTE — Discharge Instructions (Addendum)
Recommend start Augmentin  twice a day for 7 days- take with food. May continue Tessalon cough pills every 8 hours as needed. Continue to push fluids to help loosen up mucus in chest. Since your sodium, hemoglobin and hematocrit are lower, this will cause you to feel more tired and weak. Recommend try to increase protein in your diet, such as drink protein shakes/Ensure and increase iron in your diet. Your sugar is also high today (346) so continue to take your medications to decrease your sugar. Your blood pressure is also lower today but you have a history of lower blood pressure so you need to change positions slowly and continue to monitor. If any increased difficulty breathing or chest pain occur, any dizziness occurs or cough worsens, go to the ER ASAP. Otherwise, follow-up with your PCP in 3 to 5 days for recheck or sooner if needed.

## 2023-03-08 NOTE — ED Provider Notes (Signed)
[facility]    CSN: [id number] Arrival date & time: 03/08/23  1244      History   Chief Complaint Chief Complaint  Patient presents with   Cough   Nasal Congestion   Weakness    HPI Kristin Cruz is a 62 y.o. female.   62 year old female presents with cough, chest and nasal congestion for over 2 weeks- getting worse. Has noticed some shortness of breath and central lower chest pain. Also has felt weak and has had frontal headaches. Was seen at Urgent Care in [place] about 10 days ago- was placed on Tessalon cough pills which do help some. Also had diarrhea at the time which has resolved after taking Cipro. Has history of poorly controlled diabetes with last Hgb Alc in February 2024 of 11.4 and glucose of 473. Has noticed darker urine the past few days although she indicates today her urine is lighter colored.   The history is provided by the patient.  Cough Weakness Associated symptoms: cough     Past Medical History:  Diagnosis Date   Diabetes mellitus without complication    GERD (gastroesophageal reflux disease)    High cholesterol     Patient Active Problem List   Diagnosis Date Noted   Subclinical hypothyroidism 02/05/2023   Moderate episode of recurrent major depressive disorder 06/02/2022   Hypertrophic toenail 07/15/2020   Nonintractable headache 02/18/2020   Encounter for general adult medical examination with abnormal findings 11/24/2019   Routine cervical smear 11/24/2019   Dysuria 11/24/2019   Pain of left shoulder region 08/26/2019   Type 2 diabetes mellitus with hyperglycemia 07/18/2019   Screening for breast cancer 07/18/2019   Gastroesophageal reflux disease without esophagitis 07/18/2019   Mixed hyperlipidemia 07/18/2019   Hyperlipidemia due to type 2 diabetes mellitus 12/29/2017   Type 2 diabetes mellitus with diabetic polyneuropathy, with long-term current use of insulin 12/29/2017   Vitamin D deficiency 12/29/2017     History reviewed. No pertinent surgical history.  OB History   No obstetric history on file.      Home Medications    Prior to Admission medications   Medication Sig Start Date End Date Taking? Authorizing Provider  Accu-Chek FastClix Lancets MISC Use as directed to check sugars twice daily. Dx e11.65 04/16/22   Carlean Jews, PA-C  aspirin 81 MG chewable tablet Chew by mouth.    [provider]  benzonatate (TESSALON) 100 MG capsule Take 1-2 tablets 3 times a day as needed for cough 02/27/23   Immordino, Jeannett Senior, FNP  dapagliflozin propanediol (FARXIGA) 10 MG TABS tablet Take 1 tablet (10 mg total) by mouth daily before breakfast. 06/02/22   Carlean Jews, NP  escitalopram (LEXAPRO) 5 MG tablet Take 1 tablet (5 mg total) by mouth daily. Patient not taking: Reported on 01/05/2023 06/02/22   Carlean Jews, NP  glucose blood (ACCU-CHEK GUIDE) test strip Use as instructed twice a day diag E11.65 04/16/22   McDonough, Salomon Fick, PA-C  insulin degludec (TRESIBA FLEXTOUCH) 100 UNIT/ML FlexTouch Pen Inject 25 Units into the skin daily. 06/02/22   Carlean Jews, NP  Insulin Pen Needle (PEN NEEDLES) 32G X 4 MM MISC 1 each by Does not apply route daily. To use with insulin dosing up to three times daily as needed.  E11.9 04/16/22   McDonough, Salomon Fick, PA-C  omeprazole (PRILOSEC) 40 MG capsule TAKE 1 TABLET ONCE A DAY 30 MINUTES BEFORE MEAL 04/30/21   McDonough, Salomon Fick, PA-C  rosuvastatin (CRESTOR) 10 MG tablet Take 1 tablet (10 mg total) by mouth daily. 04/16/22   McDonough, Salomon Fick, PA-C  Vitamin D, Ergocalciferol, (DRISDOL) 1.25 MG (50000 UNIT) CAPS capsule TAKE ONE CAPSULE BY MOUTH WEEKLY FOR LOW VITAMIN D 04/30/21   McDonough, Salomon Fick, PA-C    Family History Family History  Problem Relation Age of Onset   Cancer Mother    Diabetes Mother    Alcohol abuse Son    Cancer Maternal Aunt    Breast cancer Neg Hx     Social History Social History   Tobacco Use    Smoking status: Never   Smokeless tobacco: Current    Types: Snuff  Vaping Use   Vaping Use: Never used  Substance Use Topics   Alcohol use: Not Currently   Drug use: Never     Allergies   Patient has no known allergies.   Review of Systems Review of Systems  Respiratory:  Positive for cough.   Neurological:  Positive for weakness.     Physical Exam Triage Vital Signs ED Triage Vitals  Enc Vitals Group     BP 03/08/23 1318 (!) 94/56     Pulse Rate 03/08/23 1318 100     Resp 03/08/23 1318 20     Temp 03/08/23 1318 98.4 F (36.9 C)     Temp src --      SpO2 03/08/23 1318 98 %     Weight --      Height --      Head Circumference --      Peak Flow --      Pain Score 03/08/23 1319 0     Pain Loc --      Pain Edu? --      Excl. in GC? --    No data found.  Updated Vital Signs BP (!) 94/56   Pulse 100   Temp 98.4 F (36.9 C)   Resp 20   SpO2 98%   Visual Acuity Right Eye Distance:   Left Eye Distance:   Bilateral Distance:    Right Eye Near:   Left Eye Near:    Bilateral Near:     Physical Exam Vitals and nursing note reviewed.  Constitutional:      General: She is awake. She is not in acute distress.    Appearance: She is well-developed.     Comments: She is sitting comfortably on the exam table in no acute distress, talking in complete sentences with no increased work of breathing but appears tired.   HENT:     Head: Normocephalic and atraumatic.     Right Ear: Hearing, tympanic membrane, ear canal and external ear normal.     Left Ear: Hearing, tympanic membrane, ear canal and external ear normal.     Nose: Congestion present.     Right Sinus: Frontal sinus tenderness present. No maxillary sinus tenderness.     Left Sinus: Frontal sinus tenderness present. No maxillary sinus tenderness.     Mouth/Throat:     Lips: Pink.     Mouth: Mucous membranes are moist.     Dentition: Has dentures.     Pharynx: Oropharynx is clear. No pharyngeal swelling,  posterior oropharyngeal erythema or uvula swelling.  Cardiovascular:     Rate and Rhythm: Normal rate and regular rhythm.     Heart sounds: Normal heart sounds. No murmur heard.    Comments: Apical heart rate = 94 Pulmonary:     Effort: Pulmonary  effort is normal. No respiratory distress.     Breath sounds: Normal air entry. Examination of the right-upper field reveals rhonchi. Examination of the left-upper field reveals rhonchi. Rhonchi present.     Comments: Rhonchi in upper lobes mainly with coughing Slight wheezes present in all lung fields Musculoskeletal:     Cervical back: Normal range of motion and neck supple.  Lymphadenopathy:     Cervical: No cervical adenopathy.  Skin:    General: Skin is warm and dry.     Capillary Refill: Capillary refill takes less than 2 seconds.     Findings: No rash.  Neurological:     General: No focal deficit present.     Mental Status: She is alert and oriented to person, place, and time.  Psychiatric:        Behavior: Behavior is cooperative.      UC Treatments / Results  Labs (all labs ordered are listed, but only abnormal results are displayed) Labs Reviewed  URINALYSIS, ROUTINE W REFLEX MICROSCOPIC - Abnormal; Notable for the following components:      Result Value   Glucose, UA >1,000 (*)    All other components within normal limits  CBC WITH DIFFERENTIAL/PLATELET - Abnormal; Notable for the following components:   RBC 3.76 (*)    Hemoglobin 11.3 (*)    HCT 33.0 (*)    All other components within normal limits  COMPREHENSIVE METABOLIC PANEL    EKG   Radiology No results found.  Procedures Procedures (including critical care time)  Medications Ordered in UC Medications - No data to display  Initial Impression / Assessment and Plan / UC Course  I have reviewed the triage vital signs and the nursing notes.  Pertinent labs & imaging results that were available during my care of the patient were reviewed by me and  considered in my medical decision making (see chart for details).     *** Final Clinical Impressions(s) / UC Diagnoses   Final diagnoses:  None   Discharge Instructions   None    ED Prescriptions   None    PDMP not reviewed this encounter.

## 2023-03-08 NOTE — ED Triage Notes (Addendum)
Cough, chest pain, headache, weakness and low back pain also c/o dark urine. Pt also c/o BS being greater than 290 lately

## 2023-03-14 ENCOUNTER — Encounter: Payer: Self-pay | Admitting: *Deleted

## 2023-03-15 ENCOUNTER — Telehealth: Payer: Self-pay | Admitting: *Deleted

## 2023-03-15 NOTE — Telephone Encounter (Signed)
Pt daughter called to say that pt needs to  get in for UC follow up and cough.  The number she gave me was her moms and I tried to call and it rang once and then said VM was not set up. If she calls back please try and get her set up with an appointment.

## 2023-03-16 NOTE — Telephone Encounter (Signed)
Attempted to contact patient again and mailbox was full so unable to LVM, if pt calls back please assist in getting an appointment scheduled.

## 2023-03-22 ENCOUNTER — Encounter: Payer: Self-pay | Admitting: Family Medicine

## 2023-03-22 ENCOUNTER — Ambulatory Visit (INDEPENDENT_AMBULATORY_CARE_PROVIDER_SITE_OTHER): Payer: PRIVATE HEALTH INSURANCE | Admitting: Family Medicine

## 2023-03-22 VITALS — BP 94/63 | HR 94 | Resp 18 | Ht 61.42 in | Wt 127.0 lb

## 2023-03-22 DIAGNOSIS — R052 Subacute cough: Secondary | ICD-10-CM

## 2023-03-22 NOTE — Progress Notes (Signed)
   Established Patient Office Visit  Subjective   Patient ID: Kristin Cruz, female    DOB: 1961-08-14  Age: 62 y.o. MRN: [id number]  Chief Complaint  Patient presents with   Follow-up   Bronchitis    HPI Kristin Cruz is a 62 y.o. female presenting today for follow up of cough.  She has been to urgent care twice because of the cough, most recently on 03/08/2023.  Prior to that, she had 2 weeks of chest and nasal congestion that was gradually worsening with shortness of breath and central lower chest pain.  She also endorsed weakness and frontal headaches.  They were not able to perform an EKG despite multiple attempts.  She was treated for sinusitis with cough with Augmentin 875 mg twice a day for 7 days and Tessalon Perles every 8 hours as needed for cough.  She was hypotensive with blood pressure 94/56 at urgent care.  Since that time, she has finished her course of antibiotics and symptoms have greatly improved.  She has a little bit of a lingering dry cough but states that it is not currently bothersome, she only has to take cough medicine maybe once a day.  ROS Negative unless otherwise noted in HPI   Objective:     BP 94/63 (BP Location: Right Arm, Patient Position: Sitting, Cuff Size: Normal)   Pulse 94   Resp 18   Ht 5' 1.42" (1.56 m)   Wt 127 lb (57.6 kg)   SpO2 97%   BMI 23.67 kg/m   Physical Exam Constitutional:      General: She is not in acute distress.    Appearance: Normal appearance.  HENT:     Head: Normocephalic and atraumatic.     Nose: Nose normal. No congestion or rhinorrhea.     Mouth/Throat:     Mouth: Mucous membranes are moist.     Pharynx: No oropharyngeal exudate or posterior oropharyngeal erythema.  Eyes:     Extraocular Movements: Extraocular movements intact.     Conjunctiva/sclera: Conjunctivae normal.  Cardiovascular:     Rate and Rhythm: Normal rate and regular rhythm.     Pulses: Normal pulses.     Heart sounds: No murmur  heard.    No friction rub. No gallop.  Pulmonary:     Effort: Pulmonary effort is normal. No respiratory distress.     Breath sounds: Normal breath sounds. No wheezing, rhonchi or rales.  Skin:    General: Skin is warm and dry.  Neurological:     Mental Status: She is alert and oriented to person, place, and time.     Assessment & Plan:  Subacute cough  Lungs were clear to auscultation bilaterally on exam.  Patient states that symptoms have greatly improved.  We discussed continuing to maintain adequate hydration both for recovery from her acute illness as well as maintaining blood pressure.  Dry cough should improve over time, she still has some Tessalon Perles that she can take as needed.  Return in 15 days (on 04/06/2023) for follow-up with Vincent Gros NP.    Melida Quitter, PA

## 2023-03-28 ENCOUNTER — Other Ambulatory Visit: Payer: Self-pay | Admitting: Physician Assistant

## 2023-03-28 DIAGNOSIS — E782 Mixed hyperlipidemia: Secondary | ICD-10-CM

## 2023-04-06 ENCOUNTER — Ambulatory Visit (INDEPENDENT_AMBULATORY_CARE_PROVIDER_SITE_OTHER): Payer: No Typology Code available for payment source | Admitting: Nurse Practitioner

## 2023-04-06 ENCOUNTER — Encounter: Payer: Self-pay | Admitting: Nurse Practitioner

## 2023-04-06 VITALS — BP 129/76 | HR 90 | Ht 61.42 in | Wt 129.8 lb

## 2023-04-06 DIAGNOSIS — E1165 Type 2 diabetes mellitus with hyperglycemia: Secondary | ICD-10-CM

## 2023-04-06 DIAGNOSIS — Z794 Long term (current) use of insulin: Secondary | ICD-10-CM | POA: Diagnosis not present

## 2023-04-06 DIAGNOSIS — E785 Hyperlipidemia, unspecified: Secondary | ICD-10-CM

## 2023-04-06 DIAGNOSIS — E1169 Type 2 diabetes mellitus with other specified complication: Secondary | ICD-10-CM

## 2023-04-06 DIAGNOSIS — B3731 Acute candidiasis of vulva and vagina: Secondary | ICD-10-CM

## 2023-04-06 DIAGNOSIS — R202 Paresthesia of skin: Secondary | ICD-10-CM

## 2023-04-06 DIAGNOSIS — R200 Anesthesia of skin: Secondary | ICD-10-CM

## 2023-04-06 DIAGNOSIS — E782 Mixed hyperlipidemia: Secondary | ICD-10-CM

## 2023-04-06 LAB — POCT GLYCOSYLATED HEMOGLOBIN (HGB A1C): HbA1c POC (<> result, manual entry): 9.6 % (ref 4.0–5.6)

## 2023-04-06 MED ORDER — TRESIBA FLEXTOUCH 100 UNIT/ML ~~LOC~~ SOPN
30.0000 [IU] | PEN_INJECTOR | Freq: Every day | SUBCUTANEOUS | 3 refills | Status: DC
Start: 2023-04-06 — End: 2024-06-06

## 2023-04-06 MED ORDER — ROSUVASTATIN CALCIUM 10 MG PO TABS: 10.0000 mg | ORAL_TABLET | Freq: Every day | ORAL | 1 refills | Status: DC

## 2023-04-06 MED ORDER — FLUCONAZOLE 150 MG PO TABS
ORAL_TABLET | ORAL | 2 refills | Status: DC
Start: 2023-04-06 — End: 2023-11-25

## 2023-04-06 NOTE — Patient Instructions (Signed)
Gradually increase Tresiba to 30 units every evening.

## 2023-04-06 NOTE — Progress Notes (Signed)
Established patient visit   Patient: Kristin Cruz   DOB: 01-01-1961   62 y.o. Female  MRN: [id number] Visit Date: 04/06/2023   Chief Complaint  Patient presents with   Medical Management of Chronic Issues   Subjective    HPI  Urgent care follow up  -moderate  to severe bronchitis and UTI -has resolved. Patient has returned to work.   Insulin dependant diabetic.  -currently taking tresiba 25 units daily  -HgbA1c has improved from 11.4 to 9.6 -feels tired, but good  overall.   Blood pressure well controlled.   No new concerns or complaints. --She denies chest pain, chest pressure, or shortness of breath. She denies headaches or visual disturbances. She denies abdominal pain, nausea, vomiting, or changes in bowel or bladder habits.     Medications: Outpatient Medications Prior to Visit  Medication Sig   Accu-Chek FastClix Lancets MISC Use as directed to check sugars twice daily. Dx e11.65   aspirin 81 MG chewable tablet Chew by mouth.   dapagliflozin propanediol (FARXIGA) 10 MG TABS tablet Take 1 tablet (10 mg total) by mouth daily before breakfast.   escitalopram (LEXAPRO) 5 MG tablet Take 1 tablet (5 mg total) by mouth daily.   glucose blood (ACCU-CHEK GUIDE) test strip Use as instructed twice a day diag E11.65   Insulin Pen Needle (PEN NEEDLES) 32G X 4 MM MISC 1 each by Does not apply route daily. To use with insulin dosing up to three times daily as needed.  E11.9   omeprazole (PRILOSEC) 40 MG capsule TAKE 1 TABLET ONCE A DAY 30 MINUTES BEFORE MEAL   Vitamin D, Ergocalciferol, (DRISDOL) 1.25 MG (50000 UNIT) CAPS capsule TAKE ONE CAPSULE BY MOUTH WEEKLY FOR LOW VITAMIN D   [DISCONTINUED] insulin degludec (TRESIBA FLEXTOUCH) 100 UNIT/ML FlexTouch Pen Inject 25 Units into the skin daily.   [DISCONTINUED] rosuvastatin (CRESTOR) 10 MG tablet Take 1 tablet (10 mg total) by mouth daily.   [DISCONTINUED] benzonatate (TESSALON) 100 MG capsule Take 1-2 tablets 3 times a day as  needed for cough   No facility-administered medications prior to visit.    Review of Systems See HPI     Last CBC Lab Results  Component Value Date   WBC 6.4 03/08/2023   HGB 11.3 (L) 03/08/2023   HCT 33.0 (L) 03/08/2023   MCV 87.8 03/08/2023   MCH 30.1 03/08/2023   RDW 11.8 03/08/2023   PLT 213 03/08/2023   Last metabolic panel Lab Results  Component Value Date   GLUCOSE 346 (H) 03/08/2023   NA 133 (L) 03/08/2023   K 3.5 03/08/2023   CL 101 03/08/2023   CO2 28 03/08/2023   BUN 11 03/08/2023   CREATININE 0.65 03/08/2023   GFRNONAA >60 03/08/2023   CALCIUM 8.6 (L) 03/08/2023   PROT 7.0 03/08/2023   ALBUMIN 3.6 03/08/2023   LABGLOB 2.6 01/05/2023   AGRATIO 1.7 01/05/2023   BILITOT 1.8 (H) 03/08/2023   ALKPHOS 86 03/08/2023   AST 24 03/08/2023   ALT 17 03/08/2023   ANIONGAP 4 (L) 03/08/2023   Last lipids Lab Results  Component Value Date   CHOL 206 (H) 01/05/2023   HDL 67 01/05/2023   LDLCALC 117 (H) 01/05/2023   TRIG 123 01/05/2023   CHOLHDL 3.1 01/05/2023   Last hemoglobin A1c Lab Results  Component Value Date   HGBA1C 9.6 04/06/2023   Last thyroid functions Lab Results  Component Value Date   TSH 1.790 01/05/2023   Last vitamin D Lab Results  Component Value Date   VD25OH 15.7 (L) 01/05/2023       Objective     Today's Vitals   04/06/23 1053  BP: 129/76  Pulse: 90  SpO2: 98%  Weight: 129 lb 12.8 oz (58.9 kg)  Height: 5' 1.42" (1.56 m)   Body mass index is 24.19 kg/m.  BP Readings from Last 3 Encounters:  04/06/23 129/76  03/22/23 94/63  03/08/23 (Abnormal) 94/56    Wt Readings from Last 3 Encounters:  04/06/23 129 lb 12.8 oz (58.9 kg)  03/22/23 127 lb (57.6 kg)  01/05/23 127 lb 1.9 oz (57.7 kg)    Physical Exam Vitals and nursing note reviewed.  Constitutional:      Appearance: Normal appearance. She is well-developed.  HENT:     Head: Normocephalic and atraumatic.     Nose: Nose normal.     Mouth/Throat:     Mouth:  Mucous membranes are moist.     Pharynx: Oropharynx is clear.  Eyes:     Extraocular Movements: Extraocular movements intact.     Conjunctiva/sclera: Conjunctivae normal.     Pupils: Pupils are equal, round, and reactive to light.  Neck:     Vascular: No carotid bruit.  Cardiovascular:     Rate and Rhythm: Normal rate and regular rhythm.     Pulses: Normal pulses.     Heart sounds: Normal heart sounds.  Pulmonary:     Effort: Pulmonary effort is normal.     Breath sounds: Normal breath sounds.  Abdominal:     Palpations: Abdomen is soft.  Musculoskeletal:        General: Normal range of motion.     Cervical back: Normal range of motion and neck supple.  Lymphadenopathy:     Cervical: No cervical adenopathy.  Skin:    General: Skin is warm and dry.     Capillary Refill: Capillary refill takes less than 2 seconds.  Neurological:     General: No focal deficit present.     Mental Status: She is alert and oriented to person, place, and time.  Psychiatric:        Mood and Affect: Mood normal.        Behavior: Behavior normal.        Thought Content: Thought content normal.        Judgment: Judgment normal.     Results for orders placed or performed in visit on 04/06/23  POCT glycosylated hemoglobin (Hb A1C)  Result Value Ref Range   Hemoglobin A1C     HbA1c POC (<> result, manual entry) 9.6 4.0 - 5.6 %   HbA1c, POC (prediabetic range)     HbA1c, POC (controlled diabetic range)      Assessment & Plan    Type 2 diabetes mellitus with hyperglycemia, with long-term current use of insulin (HCC) Assessment & Plan: HgbA1c improved from 11.4  to 9.6.  -will have her gradually increase Tresiba  to 30 units daily  -avoid excess carbohydrates and sugar.  -recheck HgbA1c in 3 months.   Orders: -     POCT glycosylated hemoglobin (Hb A1C) -     Evaristo Bury FlexTouch; Inject 30 Units into the skin daily.  Dispense: 3 mL; Refill: 3  Hyperlipidemia associated with type 2 diabetes  mellitus (HCC) -     Rosuvastatin Calcium; Take 1 tablet (10 mg total) by mouth daily.  Dispense: 90 tablet; Refill: 1  Numbness and tingling of both feet Assessment & Plan: Likely result of diabetic neuropathy  -  refer to podiatry for furhter evaluation.   Orders: -     Ambulatory referral to Podiatry  Vaginal candida Assessment & Plan: May take diflucan as needed and as prescribed for acute yeast infection.   Orders: -     Fluconazole; Take 1 tablet po once. May repeat dose in 3 days as needed for persistent symptoms.  Dispense: 5 tablet; Refill: 2  Hyperlipidemia due to type 2 diabetes mellitus [facility]) Assessment & Plan: Recent lipid panel -   Lipid Panel     Component Value Date/Time   CHOL 206 (H) 01/05/2023 0928   TRIG 123 01/05/2023 0928   HDL 67 01/05/2023 0928   CHOLHDL 3.1 01/05/2023 0928   LDLCALC 117 (H) 01/05/2023 0928   LABVLDL 22 01/05/2023 0928   Currently on crestor 5 mg every evening.        Return in about 3 months (around 07/07/2023) for health maintenance exam, with pap, check HgbA1c - see below. Marland Kitchen         Carlean Jews, NP  [facility] [phone] (phone) [phone] (fax)  [facility]

## 2023-04-13 ENCOUNTER — Telehealth: Payer: Self-pay

## 2023-04-13 ENCOUNTER — Other Ambulatory Visit: Payer: Self-pay

## 2023-04-13 DIAGNOSIS — Z1211 Encounter for screening for malignant neoplasm of colon: Secondary | ICD-10-CM

## 2023-04-13 MED ORDER — NA SULFATE-K SULFATE-MG SULF 17.5-3.13-1.6 GM/177ML PO SOLN: 1.0000 | Freq: Once | ORAL | 0 refills | Status: AC

## 2023-04-13 NOTE — Telephone Encounter (Signed)
Pt left vm to schedule colonoscopy please return call 

## 2023-04-13 NOTE — Telephone Encounter (Signed)
Gastroenterology Pre-Procedure Review  Request Date: 05/09/23 Requesting Physician: Dr. Servando Snare  PATIENT REVIEW QUESTIONS: The patient responded to the following health history questions as indicated:    1. Are you having any GI issues?  Diarrhea on and off 2. Do you have a personal history of Polyps? no 3. Do you have a family history of Colon Cancer or Polyps? no 4. Diabetes Mellitus? yes (has been advised to stop farxiga 3 days prior to colonoscopy.  Take half usual dose of insulin the day before colonoscopy) 5. Joint replacements in the past 12 months?no 6. Major health problems in the past 3 months?no 7. Any artificial heart valves, MVP, or defibrillator?no    MEDICATIONS & ALLERGIES:    Patient reports the following regarding taking any anticoagulation/antiplatelet therapy:   Plavix, Coumadin, Eliquis, Xarelto, Lovenox, Pradaxa, Brilinta, or Effient? no Aspirin? yes (81mg )  Patient confirms/reports the following medications:  Current Outpatient Medications  Medication Sig Dispense Refill   Accu-Chek FastClix Lancets MISC Use as directed to check sugars twice daily. Dx e11.65 100 each 3   aspirin 81 MG chewable tablet Chew by mouth.     dapagliflozin propanediol (FARXIGA) 10 MG TABS tablet Take 1 tablet (10 mg total) by mouth daily before breakfast. 90 tablet 1   escitalopram (LEXAPRO) 5 MG tablet Take 1 tablet (5 mg total) by mouth daily. 30 tablet 2   fluconazole (DIFLUCAN) 150 MG tablet Take 1 tablet po once. May repeat dose in 3 days as needed for persistent symptoms. 5 tablet 2   glucose blood (ACCU-CHEK GUIDE) test strip Use as instructed twice a day diag E11.65 100 each 1   insulin degludec (TRESIBA FLEXTOUCH) 100 UNIT/ML FlexTouch Pen Inject 30 Units into the skin daily. 3 mL 3   Insulin Pen Needle (PEN NEEDLES) 32G X 4 MM MISC 1 each by Does not apply route daily. To use with insulin dosing up to three times daily as needed.  E11.9 100 each 5   omeprazole (PRILOSEC) 40 MG  capsule TAKE 1 TABLET ONCE A DAY 30 MINUTES BEFORE MEAL 30 capsule 3   rosuvastatin (CRESTOR) 10 MG tablet Take 1 tablet (10 mg total) by mouth daily. 90 tablet 1   Vitamin D, Ergocalciferol, (DRISDOL) 1.25 MG (50000 UNIT) CAPS capsule TAKE ONE CAPSULE BY MOUTH WEEKLY FOR LOW VITAMIN D 4 capsule 3   No current facility-administered medications for this visit.    Patient confirms/reports the following allergies:  No Known Allergies  No orders of the defined types were placed in this encounter.   AUTHORIZATION INFORMATION Primary Insurance: 1D#: Group #:  Secondary Insurance: 1D#: Group #:  SCHEDULE INFORMATION: Date: 05/09/23 Time: Location: [facility]

## 2023-04-17 DIAGNOSIS — R200 Anesthesia of skin: Secondary | ICD-10-CM | POA: Insufficient documentation

## 2023-04-17 DIAGNOSIS — B3731 Acute candidiasis of vulva and vagina: Secondary | ICD-10-CM | POA: Insufficient documentation

## 2023-04-17 NOTE — Assessment & Plan Note (Signed)
Recent lipid panel -   Lipid Panel     Component Value Date/Time   CHOL 206 (H) 01/05/2023 0928   TRIG 123 01/05/2023 0928   HDL 67 01/05/2023 0928   CHOLHDL 3.1 01/05/2023 0928   LDLCALC 117 (H) 01/05/2023 0928   LABVLDL 22 01/05/2023 0928   Currently on crestor 5 mg every evening.

## 2023-04-17 NOTE — Assessment & Plan Note (Signed)
>>  ASSESSMENT AND PLAN FOR TYPE 2 DIABETES MELLITUS WITH HYPERGLYCEMIA (HCC) WRITTEN ON 04/17/2023  3:42 PM BY BOSCIA, HEATHER E, NP  HgbA1c improved from 11.4  to 9.6.  -will have her gradually increase Tresiba  to 30 units daily  -avoid excess carbohydrates and sugar.  -recheck HgbA1c in 3 months.

## 2023-04-17 NOTE — Assessment & Plan Note (Signed)
May take diflucan as needed and as prescribed for acute yeast infection.

## 2023-04-17 NOTE — Assessment & Plan Note (Signed)
HgbA1c improved from 11.4  to 9.6.  -will have her gradually increase Tresiba  to 30 units daily  -avoid excess carbohydrates and sugar.  -recheck HgbA1c in 3 months.

## 2023-04-17 NOTE — Assessment & Plan Note (Signed)
Likely result of diabetic neuropathy  -refer to podiatry for furhter evaluation.

## 2023-05-17 ENCOUNTER — Encounter: Payer: Self-pay | Admitting: Gastroenterology

## 2023-05-17 NOTE — Anesthesia Preprocedure Evaluation (Addendum)
Anesthesia Evaluation  Patient identified by MRN, date of birth, ID band Patient awake    Reviewed: Allergy & Precautions, H&P , NPO status , Patient's Chart, lab work & pertinent test results  Airway Mallampati: II  TM Distance: >3 FB Neck ROM: Full    Dental no notable dental hx. (+) Poor Dentition, Missing Multiple missing teeth:   Pulmonary neg pulmonary ROS   Pulmonary exam normal breath sounds clear to auscultation       Cardiovascular negative cardio ROS Normal cardiovascular exam Rhythm:Regular Rate:Normal     Neuro/Psych  Headaches PSYCHIATRIC DISORDERS  Depression     Neuromuscular disease negative neurological ROS  negative psych ROS   GI/Hepatic Neg liver ROS,GERD  ,,  Endo/Other  diabetesHypothyroidism    Renal/GU negative Renal ROS  negative genitourinary   Musculoskeletal negative musculoskeletal ROS (+)    Abdominal   Peds negative pediatric ROS (+)  Hematology negative hematology ROS (+)   Anesthesia Other Findings Diabetes mellitus without complication GERD (gastroesophageal reflux disease) High cholesterol     Reproductive/Obstetrics negative OB ROS                              Anesthesia Physical Anesthesia Plan  ASA: 2  Anesthesia Plan: General   Post-op Pain Management:    Induction: Intravenous  PONV Risk Score and Plan:   Airway Management Planned: Natural Airway and Nasal Cannula  Additional Equipment:   Intra-op Plan:   Post-operative Plan:   Informed Consent: I have reviewed the patients History and Physical, chart, labs and discussed the procedure including the risks, benefits and alternatives for the proposed anesthesia with the patient or authorized representative who has indicated his/her understanding and acceptance.     Dental Advisory Given  Plan Discussed with: Anesthesiologist, CRNA and Surgeon  Anesthesia Plan Comments: (Patient  consented for risks of anesthesia including but not limited to:  - adverse reactions to medications - risk of airway placement if required - damage to eyes, teeth, lips or other oral mucosa - nerve damage due to positioning  - sore throat or hoarseness - Damage to heart, brain, nerves, lungs, other parts of body or loss of life  Patient voiced understanding.)       Anesthesia Quick Evaluation

## 2023-05-23 ENCOUNTER — Other Ambulatory Visit: Payer: Self-pay

## 2023-05-23 ENCOUNTER — Encounter: Payer: Self-pay | Admitting: Gastroenterology

## 2023-05-23 ENCOUNTER — Ambulatory Visit
Admission: RE | Disposition: A | Discharge status: Home / Self Care | Payer: Self-pay | Source: Home / Self Care | Attending: Gastroenterology

## 2023-05-23 ENCOUNTER — Ambulatory Visit
Admission: RE | Admit: 2023-05-23 | Discharge: 2023-05-23 | Disposition: A | Discharge status: Home / Self Care | Payer: PRIVATE HEALTH INSURANCE | Attending: Gastroenterology | Admitting: Gastroenterology

## 2023-05-23 ENCOUNTER — Ambulatory Visit: Payer: PRIVATE HEALTH INSURANCE | Admitting: Anesthesiology

## 2023-05-23 DIAGNOSIS — Z1211 Encounter for screening for malignant neoplasm of colon: Secondary | ICD-10-CM | POA: Diagnosis present

## 2023-05-23 DIAGNOSIS — E119 Type 2 diabetes mellitus without complications: Secondary | ICD-10-CM | POA: Insufficient documentation

## 2023-05-23 DIAGNOSIS — K640 First degree hemorrhoids: Secondary | ICD-10-CM | POA: Insufficient documentation

## 2023-05-23 DIAGNOSIS — Z794 Long term (current) use of insulin: Secondary | ICD-10-CM | POA: Insufficient documentation

## 2023-05-23 DIAGNOSIS — Z7984 Long term (current) use of oral hypoglycemic drugs: Secondary | ICD-10-CM | POA: Insufficient documentation

## 2023-05-23 DIAGNOSIS — D124 Benign neoplasm of descending colon: Secondary | ICD-10-CM | POA: Insufficient documentation

## 2023-05-23 DIAGNOSIS — K635 Polyp of colon: Secondary | ICD-10-CM

## 2023-05-23 HISTORY — PX: COLONOSCOPY WITH PROPOFOL: SHX5780

## 2023-05-23 HISTORY — PX: POLYPECTOMY: SHX5525

## 2023-05-23 LAB — GLUCOSE, CAPILLARY: Glucose-Capillary: 222 mg/dL — ABNORMAL HIGH (ref 70–99)

## 2023-05-23 SURGERY — COLONOSCOPY WITH PROPOFOL
Anesthesia: General | Site: Rectum

## 2023-05-23 MED ORDER — STERILE WATER FOR IRRIGATION IR SOLN
Status: DC | PRN
  Administered 2023-05-23: 180 mL

## 2023-05-23 MED ORDER — LIDOCAINE HCL (CARDIAC) PF 100 MG/5ML IV SOSY
PREFILLED_SYRINGE | INTRAVENOUS | Status: DC | PRN
  Administered 2023-05-23: 50 mg via INTRAVENOUS

## 2023-05-23 MED ORDER — STERILE WATER FOR IRRIGATION IR SOLN
Status: DC | PRN
  Administered 2023-05-23: 100 mL

## 2023-05-23 MED ORDER — SODIUM CHLORIDE 0.9 % IV SOLN: INTRAVENOUS | Status: DC

## 2023-05-23 MED ORDER — PROPOFOL 10 MG/ML IV BOLUS
INTRAVENOUS | Status: DC | PRN
  Administered 2023-05-23 (×2): 40 mg via INTRAVENOUS
  Administered 2023-05-23: 30 mg via INTRAVENOUS
  Administered 2023-05-23: 90 mg via INTRAVENOUS

## 2023-05-23 MED ORDER — LACTATED RINGERS IV SOLN: INTRAVENOUS | Status: DC

## 2023-05-23 SURGICAL SUPPLY — 21 items

## 2023-05-23 NOTE — Transfer of Care (Signed)
Immediate Anesthesia Transfer of Care Note  Patient: Kristin Cruz  Procedure(s) Performed: COLONOSCOPY WITH PROPOFOL  Patient Location: PACU  Anesthesia Type: General  Level of Consciousness: awake, alert  and patient cooperative  Airway and Oxygen Therapy: Patient Spontanous Breathing and Patient connected to supplemental oxygen  Post-op Assessment: Post-op Vital signs reviewed, Patient's Cardiovascular Status Stable, Respiratory Function Stable, Patent Airway and No signs of Nausea or vomiting  Post-op Vital Signs: Reviewed and stable  Complications: No notable events documented.

## 2023-05-23 NOTE — Op Note (Signed)
[facility] Gastroenterology Patient Name: Kristin Cruz Procedure Date: 05/23/2023 8:23 AM MRN: [id number] Account #: [id number] Date of Birth: 08-Sep-1961 Admit Type: Outpatient Age: 62 Room: [facility] OR ROOM 01 Gender: Female Note Status: Finalized Instrument Name: [id number] Procedure:             Colonoscopy Indications:           Screening for colorectal malignant neoplasm Providers:             Midge Minium MD, MD Medicines:             Propofol per Anesthesia Complications:         No immediate complications. Procedure:             Pre-Anesthesia Assessment:                        - Prior to the procedure, a History and Physical was                         performed, and patient medications and allergies were                         reviewed. The patient's tolerance of previous                         anesthesia was also reviewed. The risks and benefits                         of the procedure and the sedation options and risks                         were discussed with the patient. All questions were                         answered, and informed consent was obtained. Prior                         Anticoagulants: The patient has taken no anticoagulant                         or antiplatelet agents. ASA Grade Assessment: II - A                         patient with mild systemic disease. After reviewing                         the risks and benefits, the patient was deemed in                         satisfactory condition to undergo the procedure.                        After obtaining informed consent, the colonoscope was                         passed under direct vision. Throughout the procedure,                         the patient's blood pressure,  pulse, and oxygen                         saturations were monitored continuously. The                         Colonoscope was introduced through the anus and                         advanced to the the cecum,  identified by appendiceal                         orifice and ileocecal valve. The colonoscopy was                         performed without difficulty. The patient tolerated                         the procedure well. The quality of the bowel                         preparation was excellent. Findings:      The perianal and digital rectal examinations were normal.      A 2 mm polyp was found in the ascending colon. The polyp was sessile.       The polyp was removed with a cold biopsy forceps. Resection and       retrieval were complete.      A 5 mm polyp was found in the descending colon. The polyp was sessile.       The polyp was removed with a cold snare. Resection and retrieval were       complete.      Non-bleeding internal hemorrhoids were found during retroflexion. The       hemorrhoids were Grade I (internal hemorrhoids that do not prolapse). Impression:            - One 2 mm polyp in the ascending colon, removed with                         a cold biopsy forceps. Resected and retrieved.                        - One 5 mm polyp in the descending colon, removed with                         a cold snare. Resected and retrieved.                        - Non-bleeding internal hemorrhoids. Recommendation:        - Discharge patient to home.                        - Resume previous diet.                        - If the pathology report reveals adenomatous tissue,                         then repeat the colonoscopy for surveillance in 7  years. Procedure Code(s):     --- Professional ---                        [phone], Colonoscopy, flexible; with removal of                         tumor(s), polyp(s), or other lesion(s) by snare                         technique                        45380, 59, Colonoscopy, flexible; with biopsy, single                         or multiple Diagnosis Code(s):     --- Professional ---                        Z12.11, Encounter for  screening for malignant neoplasm                         of colon                        D12.4, Benign neoplasm of descending colon CPT copyright 2022 American Medical Association. All rights reserved. The codes documented in this report are preliminary and upon coder review may  be revised to meet current compliance requirements. Midge Minium MD, MD 05/23/2023 8:49:46 AM This report has been signed electronically. Number of Addenda: 0 Note Initiated On: 05/23/2023 8:23 AM Scope Withdrawal Time: 0 hours 10 minutes 57 seconds  Total Procedure Duration: 0 hours 14 minutes 15 seconds  Estimated Blood Loss:  Estimated blood loss: none.      [facility]

## 2023-05-23 NOTE — Anesthesia Postprocedure Evaluation (Signed)
Anesthesia Post Note  Patient: Kristin Cruz  Procedure(s) Performed: COLONOSCOPY WITH BIOPSY (Rectum) POLYPECTOMY (Rectum)  Patient location during evaluation: PACU Anesthesia Type: General Level of consciousness: awake and alert Pain management: pain level controlled Vital Signs Assessment: post-procedure vital signs reviewed and stable Respiratory status: spontaneous breathing, nonlabored ventilation, respiratory function stable and patient connected to nasal cannula oxygen Cardiovascular status: blood pressure returned to baseline and stable Postop Assessment: no apparent nausea or vomiting Anesthetic complications: no   No notable events documented.   Last Vitals:  Vitals:   05/23/23 0901 05/23/23 0905  BP: 90/63 97/66  Pulse: 95 97  Resp: 19 14  Temp:    SpO2: 98% 98%    Last Pain:  Vitals:   05/23/23 0905  TempSrc:   PainSc: 0-No pain                 Marisue Humble

## 2023-05-23 NOTE — H&P (Signed)
Midge Minium, MD [facility] [address] [place], [place] [address] Phone: [phone] Fax : [phone]  Primary Care Physician:  Carlean Jews, NP Primary Gastroenterologist:  Dr. Servando Snare  Pre-Procedure History & Physical: HPI:  Kristin Cruz is a 62 y.o. female is here for a screening colonoscopy.   Past Medical History:  Diagnosis Date   Diabetes mellitus without complication (HCC)    GERD (gastroesophageal reflux disease)    High cholesterol     Past Surgical History:  Procedure Laterality Date   COLONOSCOPY      Prior to Admission medications   Medication Sig Start Date End Date Taking? Authorizing Provider  Accu-Chek FastClix Lancets MISC Use as directed to check sugars twice daily. Dx e11.65 04/16/22  Yes McDonough, Salomon Fick, PA-C  aspirin 81 MG chewable tablet Chew by mouth.   Yes [provider]  dapagliflozin propanediol (FARXIGA) 10 MG TABS tablet Take 1 tablet (10 mg total) by mouth daily before breakfast. 06/02/22  Yes Boscia, Heather E, NP  fluconazole (DIFLUCAN) 150 MG tablet Take 1 tablet po once. May repeat dose in 3 days as needed for persistent symptoms. 04/06/23  Yes Carlean Jews, NP  glucose blood (ACCU-CHEK GUIDE) test strip Use as instructed twice a day diag E11.65 04/16/22  Yes McDonough, Lauren K, PA-C  insulin degludec (TRESIBA FLEXTOUCH) 100 UNIT/ML FlexTouch Pen Inject 30 Units into the skin daily. 04/06/23  Yes Boscia, Heather E, NP  Insulin Pen Needle (PEN NEEDLES) 32G X 4 MM MISC 1 each by Does not apply route daily. To use with insulin dosing up to three times daily as needed.  E11.9 04/16/22  Yes McDonough, Lauren K, PA-C  omeprazole (PRILOSEC) 40 MG capsule TAKE 1 TABLET ONCE A DAY 30 MINUTES BEFORE MEAL 04/30/21  Yes McDonough, Lauren K, PA-C  rosuvastatin (CRESTOR) 10 MG tablet Take 1 tablet (10 mg total) by mouth daily. 04/06/23  Yes Boscia, Kathlynn Grate, NP  Vitamin D, Ergocalciferol, (DRISDOL) 1.25 MG (50000 UNIT) CAPS  capsule TAKE ONE CAPSULE BY MOUTH WEEKLY FOR LOW VITAMIN D 04/30/21  Yes McDonough, Lauren K, PA-C  escitalopram (LEXAPRO) 5 MG tablet Take 1 tablet (5 mg total) by mouth daily. Patient not taking: Reported on 05/17/2023 06/02/22   Carlean Jews, NP    Allergies as of 04/13/2023   (No Known Allergies)    Family History  Problem Relation Age of Onset   Cancer Mother    Diabetes Mother    Alcohol abuse Son    Cancer Maternal Aunt    Breast cancer Neg Hx     Social History   Socioeconomic History   Marital status: Legally Separated    Spouse name: Not on file   Number of children: Not on file   Years of education: Not on file   Highest education level: Not on file  Occupational History   Not on file  Tobacco Use   Smoking status: Never    Passive exposure: Never   Smokeless tobacco: Current    Types: Snuff  Vaping Use   Vaping Use: Never used  Substance and Sexual Activity   Alcohol use: Not Currently   Drug use: Never   Sexual activity: Not Currently  Other Topics Concern   Not on file  Social History Narrative   Not on file   Social Determinants of Health   Financial Resource Strain: Not on file  Food Insecurity: Not on file  Transportation Needs: Not on file  Physical Activity: Not on file  Stress: Not on file  Social Connections: Not on file  Intimate Partner Violence: Not on file    Review of Systems: See HPI, otherwise negative ROS  Physical Exam: BP 95/71   Pulse 90   Temp (!) 97.5 F (36.4 C) (Temporal)   Resp (!) 25   Ht 5' 1.5" (1.562 m)   Wt 57.1 kg   SpO2 99%   BMI 23.39 kg/m  General:   Alert,  pleasant and cooperative in NAD Head:  Normocephalic and atraumatic. Neck:  Supple; no masses or thyromegaly. Lungs:  Clear throughout to auscultation.    Heart:  Regular rate and rhythm. Abdomen:  Soft, nontender and nondistended. Normal bowel sounds, without guarding, and without rebound.   Neurologic:  Alert and  oriented x4;  grossly  normal neurologically.  Impression/Plan: Kristin Cruz is now here to undergo a screening colonoscopy.  Risks, benefits, and alternatives regarding colonoscopy have been reviewed with the patient.  Questions have been answered.  All parties agreeable.

## 2023-05-24 ENCOUNTER — Encounter: Payer: Self-pay | Admitting: Gastroenterology

## 2023-05-25 NOTE — Progress Notes (Signed)
Repeat colonoscopy in 7 years.

## 2023-05-26 ENCOUNTER — Encounter: Payer: Self-pay | Admitting: Gastroenterology

## 2023-06-29 ENCOUNTER — Encounter: Payer: Self-pay | Admitting: Emergency Medicine

## 2023-06-29 ENCOUNTER — Ambulatory Visit
Admission: EM | Admit: 2023-06-29 | Discharge: 2023-06-29 | Disposition: A | Discharge status: Home / Self Care | Payer: PRIVATE HEALTH INSURANCE | Attending: Emergency Medicine | Admitting: Emergency Medicine

## 2023-06-29 DIAGNOSIS — U071 COVID-19: Secondary | ICD-10-CM | POA: Diagnosis not present

## 2023-06-29 DIAGNOSIS — R059 Cough, unspecified: Secondary | ICD-10-CM | POA: Insufficient documentation

## 2023-06-29 LAB — GLUCOSE, CAPILLARY: Glucose-Capillary: 179 mg/dL — ABNORMAL HIGH (ref 70–99)

## 2023-06-29 LAB — SARS CORONAVIRUS 2 BY RT PCR: SARS Coronavirus 2 by RT PCR: POSITIVE — AB

## 2023-06-29 NOTE — Discharge Instructions (Addendum)
You have a viral illness: no antibiotic as indicated at this time, May treat with OTC meds of choice. Make sure to drink plenty of fluids to stay hydrated(gatorade, water, popsicles,jello,etc), avoid caffeine products. Follow up with PCP. Return as needed.You are outside the window for Covid antivirals.   The CDC recommendations suggest returning to normal activities when, for at least 24 hours, symptoms are improving overall, and if a fever was present, it has been gone without use of a fever-reducing medication.  Once people resume normal activities, they are encouraged to take additional prevention strategies for the next 5 days to curb disease spread, such as taking more steps for cleaner air, enhancing hygiene practices, wearing a well-fitting mask, keeping a distance from others, and/or getting tested for respiratory viruses  GO to ER if you develop shortness of breath, chest pain,palpitations.

## 2023-06-29 NOTE — ED Provider Notes (Signed)
[facility]    CSN: [id number] Arrival date & time: 06/29/23  0846      History   Chief Complaint Chief Complaint  Patient presents with   Generalized Body Aches   Nasal Congestion   Cough    HPI Kristin Cruz is a 62 y.o. female.   62 year old female, Kristin Cruz, presents to urgent care for evaluation of bodyaches, cough, runny nose x 4 days. Pt states she has been around people with Covid at work. Pt has not checked her blood glucose.   PMH Diabetes, GERD, High cholesterol  The history is provided by the patient. No language interpreter was used.    Past Medical History:  Diagnosis Date   Diabetes mellitus without complication (HCC)    GERD (gastroesophageal reflux disease)    High cholesterol     Patient Active Problem List   Diagnosis Date Noted   COVID-19 06/29/2023   Polyp of ascending colon 05/23/2023   Encounter for screening colonoscopy 05/23/2023   Numbness and tingling of both feet 04/17/2023   Vaginal candida 04/17/2023   Subclinical hypothyroidism 02/05/2023   Moderate episode of recurrent major depressive disorder (HCC) 06/02/2022   Hypertrophic toenail 07/15/2020   Nonintractable headache 02/18/2020   Encounter for general adult medical examination with abnormal findings 11/24/2019   Routine cervical smear 11/24/2019   Dysuria 11/24/2019   Pain of left shoulder region 08/26/2019   Type 2 diabetes mellitus with hyperglycemia (HCC) 07/18/2019   Screening for breast cancer 07/18/2019   Gastroesophageal reflux disease without esophagitis 07/18/2019   Mixed hyperlipidemia 07/18/2019   Hyperlipidemia due to type 2 diabetes mellitus (HCC) 12/29/2017   Type 2 diabetes mellitus with diabetic polyneuropathy, with long-term current use of insulin (HCC) 12/29/2017   Vitamin D deficiency 12/29/2017    Past Surgical History:  Procedure Laterality Date   COLONOSCOPY     COLONOSCOPY WITH PROPOFOL N/A 05/23/2023   Procedure: COLONOSCOPY  WITH BIOPSY;  Surgeon: Midge Minium, MD;  [facility];  Service: Endoscopy;  Laterality: N/A;  Diabetic   POLYPECTOMY N/A 05/23/2023   Procedure: POLYPECTOMY;  Surgeon: Midge Minium, MD;  Location: [facility];  Service: Endoscopy;  Laterality: N/A;    OB History   No obstetric history on file.      Home Medications    Prior to Admission medications   Medication Sig Start Date End Date Taking? Authorizing Provider  Na Sulfate-K Sulfate-Mg Sulf 17.5-3.13-1.6 GM/177ML SOLN SMARTSIG:1 Kit(s) By Mouth Once 05/19/23  Yes [provider]  Accu-Chek FastClix Lancets MISC Use as directed to check sugars twice daily. Dx e11.65 04/16/22   Carlean Jews, PA-C  aspirin 81 MG chewable tablet Chew by mouth.    [provider]  dapagliflozin propanediol (FARXIGA) 10 MG TABS tablet Take 1 tablet (10 mg total) by mouth daily before breakfast. 06/02/22   Carlean Jews, NP  escitalopram (LEXAPRO) 5 MG tablet Take 1 tablet (5 mg total) by mouth daily. Patient not taking: Reported on 05/17/2023 06/02/22   Carlean Jews, NP  fluconazole (DIFLUCAN) 150 MG tablet Take 1 tablet po once. May repeat dose in 3 days as needed for persistent symptoms. 04/06/23   Carlean Jews, NP  glucose blood (ACCU-CHEK GUIDE) test strip Use as instructed twice a day diag E11.65 04/16/22   McDonough, Salomon Fick, PA-C  insulin degludec (TRESIBA FLEXTOUCH) 100 UNIT/ML FlexTouch Pen Inject 30 Units into the skin daily. 04/06/23   Carlean Jews,  NP  Insulin Pen Needle (PEN NEEDLES) 32G X 4 MM MISC 1 each by Does not apply route daily. To use with insulin dosing up to three times daily as needed.  E11.9 04/16/22   McDonough, Salomon Fick, PA-C  omeprazole (PRILOSEC) 40 MG capsule TAKE 1 TABLET ONCE A DAY 30 MINUTES BEFORE MEAL 04/30/21   McDonough, Lauren K, PA-C  rosuvastatin (CRESTOR) 10 MG tablet Take 1 tablet (10 mg total) by mouth daily. 04/06/23   Carlean Jews, NP  Vitamin D,  Ergocalciferol, (DRISDOL) 1.25 MG (50000 UNIT) CAPS capsule TAKE ONE CAPSULE BY MOUTH WEEKLY FOR LOW VITAMIN D 04/30/21   McDonough, Salomon Fick, PA-C    Family History Family History  Problem Relation Age of Onset   Cancer Mother    Diabetes Mother    Alcohol abuse Son    Cancer Maternal Aunt    Breast cancer Neg Hx     Social History Social History   Tobacco Use   Smoking status: Never    Passive exposure: Never   Smokeless tobacco: Current    Types: Snuff  Vaping Use   Vaping status: Never Used  Substance Use Topics   Alcohol use: Not Currently   Drug use: Never     Allergies   Patient has no known allergies.   Review of Systems Review of Systems  Constitutional:  Negative for fever.  HENT:  Positive for congestion and rhinorrhea.   Respiratory:  Positive for cough.   Musculoskeletal:  Positive for myalgias.  All other systems reviewed and are negative.    Physical Exam Triage Vital Signs ED Triage Vitals  Encounter Vitals Group     BP      Systolic BP Percentile      Diastolic BP Percentile      Pulse      Resp      Temp      Temp src      SpO2      Weight      Height      Head Circumference      Peak Flow      Pain Score      Pain Loc      Pain Education      Exclude from Growth Chart    No data found.  Updated Vital Signs BP 112/77 (BP Location: Left Arm)   Pulse 100   Temp 98.3 F (36.8 C) (Oral)   Resp 16   SpO2 99%   Visual Acuity Right Eye Distance:   Left Eye Distance:   Bilateral Distance:    Right Eye Near:   Left Eye Near:    Bilateral Near:     Physical Exam Vitals and nursing note reviewed.  Constitutional:      General: She is not in acute distress.    Appearance: She is well-developed.  HENT:     Head: Normocephalic.     Right Ear: Tympanic membrane is retracted.     Left Ear: Tympanic membrane is retracted.     Nose: Congestion present.     Mouth/Throat:     Lips: Pink.     Mouth: Mucous membranes are moist.      Pharynx: Oropharynx is clear.  Eyes:     General: Lids are normal.     Conjunctiva/sclera: Conjunctivae normal.     Pupils: Pupils are equal, round, and reactive to light.  Neck:     Trachea: No tracheal deviation.  Cardiovascular:  Rate and Rhythm: Regular rhythm.     Pulses: Normal pulses.     Heart sounds: Normal heart sounds. No murmur heard. Pulmonary:     Effort: Pulmonary effort is normal.     Breath sounds: Normal breath sounds.  Abdominal:     General: Bowel sounds are normal.     Palpations: Abdomen is soft.     Tenderness: There is no abdominal tenderness.  Musculoskeletal:        General: Normal range of motion.     Cervical back: Normal range of motion.  Lymphadenopathy:     Cervical: No cervical adenopathy.  Skin:    General: Skin is warm and dry.     Findings: No rash.  Neurological:     General: No focal deficit present.     Mental Status: She is alert and oriented to person, place, and time.     GCS: GCS eye subscore is 4. GCS verbal subscore is 5. GCS motor subscore is 6.  Psychiatric:        Speech: Speech normal.        Behavior: Behavior normal. Behavior is cooperative.      UC Treatments / Results  Labs (all labs ordered are listed, but only abnormal results are displayed) Labs Reviewed  SARS CORONAVIRUS 2 BY RT PCR - Abnormal; Notable for the following components:      Result Value   SARS Coronavirus 2 by RT PCR POSITIVE (*)    All other components within normal limits  GLUCOSE, CAPILLARY - Abnormal; Notable for the following components:   Glucose-Capillary 179 (*)    All other components within normal limits  CBG MONITORING, ED    EKG   Radiology No results found.  Procedures Procedures (including critical care time)  Medications Ordered in UC Medications - No data to display  Initial Impression / Assessment and Plan / UC Course  I have reviewed the triage vital signs and the nursing notes.  Pertinent labs & imaging  results that were available during my care of the patient were reviewed by me and considered in my medical decision making (see chart for details).  Clinical Course as of 06/29/23 1050  Wed Jun 29, 2023  0930 CBG is 179 in clinic, Covid pending [JD]    Clinical Course User Index [JD] Eulala Newcombe, Para March, NP    Ddx: Covid 19, viral illness, allergies Final Clinical Impressions(s) / UC Diagnoses   Final diagnoses:  COVID-19     Discharge Instructions      You have a viral illness: no antibiotic as indicated at this time, May treat with OTC meds of choice. Make sure to drink plenty of fluids to stay hydrated(gatorade, water, popsicles,jello,etc), avoid caffeine products. Follow up with PCP. Return as needed.You are outside the window for Covid antivirals.   The CDC recommendations suggest returning to normal activities when, for at least 24 hours, symptoms are improving overall, and if a fever was present, it has been gone without use of a fever-reducing medication.  Once people resume normal activities, they are encouraged to take additional prevention strategies for the next 5 days to curb disease spread, such as taking more steps for cleaner air, enhancing hygiene practices, wearing a well-fitting mask, keeping a distance from others, and/or getting tested for respiratory viruses  GO to ER if you develop shortness of breath, chest pain,palpitations.     ED Prescriptions   None    PDMP not reviewed this encounter.   Alianna Wurster, Para March,  NP 06/29/23 1051

## 2023-06-29 NOTE — ED Triage Notes (Signed)
Pt presents with bodyaches x 1 week. She developed a cough and runny nose 3 days ago.

## 2023-07-08 ENCOUNTER — Ambulatory Visit: Payer: PRIVATE HEALTH INSURANCE | Admitting: Family Medicine

## 2023-08-06 ENCOUNTER — Other Ambulatory Visit: Payer: Self-pay | Admitting: Physician Assistant

## 2023-08-06 DIAGNOSIS — Z794 Long term (current) use of insulin: Secondary | ICD-10-CM

## 2023-08-26 ENCOUNTER — Other Ambulatory Visit: Payer: Self-pay | Admitting: Physician Assistant

## 2023-08-26 DIAGNOSIS — E1165 Type 2 diabetes mellitus with hyperglycemia: Secondary | ICD-10-CM

## 2023-09-14 ENCOUNTER — Encounter: Payer: Self-pay | Admitting: Family Medicine

## 2023-09-14 ENCOUNTER — Ambulatory Visit (INDEPENDENT_AMBULATORY_CARE_PROVIDER_SITE_OTHER): Payer: PRIVATE HEALTH INSURANCE | Admitting: Family Medicine

## 2023-09-14 VITALS — BP 107/71 | HR 89 | Ht 61.5 in | Wt 133.4 lb

## 2023-09-14 DIAGNOSIS — E1142 Type 2 diabetes mellitus with diabetic polyneuropathy: Secondary | ICD-10-CM | POA: Diagnosis not present

## 2023-09-14 DIAGNOSIS — R2689 Other abnormalities of gait and mobility: Secondary | ICD-10-CM | POA: Diagnosis not present

## 2023-09-14 DIAGNOSIS — Z794 Long term (current) use of insulin: Secondary | ICD-10-CM

## 2023-09-14 LAB — POCT GLYCOSYLATED HEMOGLOBIN (HGB A1C): HbA1c POC (<> result, manual entry): 10.4 % (ref 4.0–5.6)

## 2023-09-14 MED ORDER — FREESTYLE LIBRE 3 SENSOR MISC
3 refills | Status: DC
Start: 2023-09-14 — End: 2024-06-06

## 2023-09-14 MED ORDER — DAPAGLIFLOZIN PROPANEDIOL 10 MG PO TABS
10.0000 mg | ORAL_TABLET | Freq: Every day | ORAL | 1 refills | Status: DC
Start: 2023-09-14 — End: 2024-06-06

## 2023-09-14 NOTE — Assessment & Plan Note (Signed)
Patient's A1c is 10.4 today.  Is taking Tresiba 25 units and glimepiride.  She is unsure how old her glimepiride prescription is.  She takes a half a tablet due to hypoglycemia with a full tablet. - Stop taking glimepiride.  Does not have a recent prescription for this.  Uncertain how old her current prescription is. - Restart [place].  Patient has insurance now - [employer] by 2 units/day as long as fasting blood sugar is greater than 150 - Freestyle libre ordered.  Advised patient she can reach out to [place] if she needs help setting this up - Follow-up in 1 month - Ambulatory referral to pharmacy for medication management/patient assistance.

## 2023-09-14 NOTE — Patient Instructions (Addendum)
It was nice to see you today,  We addressed the following topics today: -We need to work on getting your blood sugar under better control as well as your cholesterol. - I will send in a referral to physical therapy to help with your balance issues - I will put in a referral to the pharmacist who can help you with affording your medications so that you can get your blood sugar and cholesterol levels under better control. - I have ordered a freestyle libre continuous glucose monitor.  You can have somebody help you set this up or we can help you set it up.  Is checks her blood sugar 24 hours a day. - I do not want you to take the glimepiride anymore.  I wanted to take [place] and your [place]. - I would like you to adjust your Evaristo Bury by doing the following: 1.  Check your blood sugar first thing after you wake up and before you eat anything or drink anything. 2.  If the number is above 150, I want you to increase your Tresiba dosing by 2 units (increasing from 25 to 27 for example.). keep doing this each day.  If it ever gets below 100, decrease the Tresiba dosing by 2 units  Have a great day,  Frederic Jericho, MD

## 2023-09-14 NOTE — Progress Notes (Signed)
Acute Office Visit  Subjective:     Patient ID: Kristin Cruz, female    DOB: 1961/08/27, 62 y.o.   MRN: [id number]  Chief Complaint  Patient presents with   Dizziness    HPI Patient is in today for dizziness, imbalance  Patient states that in August she had an episode where she became unsteady and fell into her wall and then hit her head.  Around that same time she had COVID.  Has had issues with dizziness prior to that but feel they are more pronounced since that time.  Mostly describes the dizziness as lightheadedness.  She also complains of feeling off balance when she walks.  When asked she says she favors the left side.  She has not had any falls since August.  Does not describe any weakness in her lower extremities.  Does endorse paresthesia and feeling "cold in the extremities".  Also describes pins-and-needles in the extremities.  Patient denies dysarthria, dysphagia, or aphasia  Patient takes [place] 25 units before she goes to work.  She works third shift at a [employer].  She does not take metformin due to intolerance.  She does not take [place] due to not having her insurance up until recently.  She has been taking insulin and glimepiride only.  States her blood sugars have been "high" lately.  We discussed her A1c being greater than 10.  Patient states she has episodes of hypoglycemia if she takes too high dose of her glimepiride.  We discussed referrals to physical therapy which patient is agreeable to.  She lives in [place] and would prefer something closer to where she lives.  ROS      Objective:    BP 107/71   Pulse 89   Ht 5' 1.5" (1.562 m)   Wt 133 lb 6.4 oz (60.5 kg)   SpO2 99%   BMI 24.80 kg/m    Physical Exam General: Alert, oriented HEENT: PERRLA, EOMI CV: Regular rhythm Neuro: Cranial nerves II through XII grossly intact.  Normal finger-nose test. MSK: Patient with inability to stand on 1 foot for more than 1 to 2 seconds due to poor  balance.  This is bilateral.  Results for orders placed or performed in visit on 09/14/23  POCT glycosylated hemoglobin (Hb A1C)  Result Value Ref Range   Hemoglobin A1C     HbA1c POC (<> result, manual entry) 10.4 4.0 - 5.6 %   HbA1c, POC (prediabetic range)     HbA1c, POC (controlled diabetic range)          Assessment & Plan:   Type 2 diabetes mellitus with diabetic polyneuropathy, with long-term current use of insulin (HCC) Assessment & Plan: Patient's A1c is 10.4 today.  Is taking Tresiba 25 units and glimepiride.  She is unsure how old her glimepiride prescription is.  She takes a half a tablet due to hypoglycemia with a full tablet. - Stop taking glimepiride.  Does not have a recent prescription for this.  Uncertain how old her current prescription is. - Restart [place].  Patient has insurance now - [employer] by 2 units/day as long as fasting blood sugar is greater than 150 - Freestyle libre ordered.  Advised patient she can reach out to [place] if she needs help setting this up - Follow-up in 1 month - Ambulatory referral to pharmacy for medication management/patient assistance.  Orders: -     AMB Referral [facility] Care Management -     Dapagliflozin Propanediol; Take 1  tablet (10 mg total) by mouth daily before breakfast.  Dispense: 90 tablet; Refill: 1 -     FreeStyle Libre 3 Sensor; Place 1 sensor on the skin every 14 days. Use to check glucose continuously  Dispense: 2 each; Refill: 3 -     B12 and Folate Panel -     Lipid panel -     POCT glycosylated hemoglobin (Hb A1C)  Imbalance Assessment & Plan: Symptoms ongoing for several months.  Mostly unsteadiness with ambulation.  Neurologic exam normal with the exception of poor balance when standing on 1 foot.  Could be secondary to old stroke.  Focus on secondary preventive measures including blood sugar, hypertension, cholesterol control.  Refer to physical therapy for balance training.  Consider MRI of the head in the  future to evaluate for previous stroke, but given the duration of her symptoms this is less urgent and would not significantly change management if a stroke was identified as she is already on secondary preventative measures.  Orders: -     Ambulatory referral to Physical Therapy   I spent 30 minutes in the management of this patient  Return in about 6 weeks (around 10/26/2023) for Balance issues, blood sugar control.  Sandre Kitty, MD

## 2023-09-14 NOTE — Assessment & Plan Note (Addendum)
Symptoms ongoing for several months.  Mostly unsteadiness with ambulation.  Neurologic exam normal with the exception of poor balance when standing on 1 foot.  Could be secondary to old stroke.  Focus on secondary preventive measures including blood sugar, hypertension, cholesterol control.  Refer to physical therapy for balance training.  Consider MRI of the head in the future to evaluate for previous stroke, but given the duration of her symptoms this is less urgent and would not significantly change management if a stroke was identified as she is already on secondary preventative measures.

## 2023-09-15 ENCOUNTER — Telehealth: Payer: Self-pay

## 2023-09-15 ENCOUNTER — Telehealth: Payer: Self-pay | Admitting: *Deleted

## 2023-09-15 ENCOUNTER — Other Ambulatory Visit: Payer: Self-pay | Admitting: Nurse Practitioner

## 2023-09-15 DIAGNOSIS — Z794 Long term (current) use of insulin: Secondary | ICD-10-CM

## 2023-09-15 LAB — LIPID PANEL
Chol/HDL Ratio: 2.4 ratio (ref 0.0–4.4)
Cholesterol, Total: 164 mg/dL (ref 100–199)
HDL: 69 mg/dL (ref >39)
LDL Chol Calc (NIH): 79 mg/dL (ref 0–99)
Triglycerides: 88 mg/dL (ref 0–149)
VLDL Cholesterol Cal: 16 mg/dL (ref 5–40)

## 2023-09-15 LAB — B12 AND FOLATE PANEL
Folate: 8.6 ng/mL (ref >3.0)
Vitamin B-12: 884 pg/mL (ref 232–1245)

## 2023-09-15 NOTE — Telephone Encounter (Signed)
Patient called to see who called her and we saw that Care management had tried to reach her.  Pt will call back to get number to call them back when she is not driving.  SHe also wanted to say that she was here yesterday and did not go to work last night due to not feeling well and she is not feeling well today and wanted to see if provider would write her a note excusing her from work. Please advise.

## 2023-09-15 NOTE — Progress Notes (Signed)
   Care Guide Note  09/15/2023 Name: Kristin Cruz MRN: [id number] DOB: [birth year]  Referred by: Melida Quitter, PA Reason for referral : Care Coordination (Outreach to schedule with Pharm d )   [name] is a 62 y.o. year old female who is a primary care patient of Melida Quitter, [place]. Venola Felling was referred to the pharmacist for assistance related to DM.    An unsuccessful telephone outreach was attempted today to contact the patient who was referred to the pharmacy team for assistance with medication management. Additional attempts will be made to contact the patient.   Penne Lash, RMA Care Guide [facility]  [place], [place] [address] Direct Dial: [phone] [email or web address]

## 2023-09-16 NOTE — Telephone Encounter (Signed)
Pt informed of below.  

## 2023-09-21 ENCOUNTER — Encounter: Payer: PRIVATE HEALTH INSURANCE | Admitting: Family Medicine

## 2023-09-21 ENCOUNTER — Telehealth: Payer: Self-pay | Admitting: *Deleted

## 2023-09-21 NOTE — Telephone Encounter (Signed)
VM for pt to call office to confirm that she has found primary care closer to where she lives and to be sure she wanted to cancel her appt for today and on 12/11

## 2023-09-22 NOTE — Progress Notes (Signed)
   Care Guide Note  09/22/2023 Name: Donneisha Beane MRN: [id number] DOB: Aug 31, 1961  Referred by: Melida Quitter, PA Reason for referral : Care Coordination (Outreach to schedule with Pharm d )   Arpi Diebold is a 62 y.o. year old female who is a primary care patient of Melida Quitter, [place]. Berry Gallacher was referred to the pharmacist for assistance related to DM.    A second unsuccessful telephone outreach was attempted today to contact the patient who was referred to the pharmacy team for assistance with medication assistance. Additional attempts will be made to contact the patient.  Penne Lash, RMA Care Guide [facility]  [place], [place] [address] Direct Dial: [phone] [email or web address]

## 2023-09-27 NOTE — Progress Notes (Signed)
   Care Guide Note  09/27/2023 Name: Cadie Meiss MRN: [id number] DOB: [birth year]  Referred by: Melida Quitter, PA Reason for referral : Care Coordination (Outreach to schedule with Pharm d )   Salimah Ottum is a 62 y.o. year old female who is a primary care patient of Melida Quitter, [place]. Jerlene Vinsant was referred to the pharmacist for assistance related to DM.    A third unsuccessful telephone outreach was attempted today to contact the patient who was referred to the pharmacy team for assistance with medication assistance. The Population Health team is pleased to engage with this patient at any time in the future upon receipt of referral and should he/she be interested in assistance from the [facility] team.   Penne Lash, RMA Care Guide [facility]  [place], [place] [address] Direct Dial: [phone] [email or web address]

## 2023-10-26 ENCOUNTER — Encounter: Payer: PRIVATE HEALTH INSURANCE | Admitting: Family Medicine

## 2023-10-26 NOTE — Progress Notes (Unsigned)
This encounter was created in error - please disregard.

## 2023-11-25 ENCOUNTER — Encounter: Payer: Self-pay | Admitting: Emergency Medicine

## 2023-11-25 ENCOUNTER — Ambulatory Visit: Payer: BC Managed Care – PPO

## 2023-11-25 ENCOUNTER — Ambulatory Visit
Admission: EM | Admit: 2023-11-25 | Discharge: 2023-11-25 | Disposition: A | Discharge status: Home / Self Care | Payer: BC Managed Care – PPO | Attending: Family Medicine | Admitting: Family Medicine

## 2023-11-25 DIAGNOSIS — R600 Localized edema: Secondary | ICD-10-CM | POA: Diagnosis not present

## 2023-11-25 DIAGNOSIS — M7989 Other specified soft tissue disorders: Secondary | ICD-10-CM | POA: Diagnosis not present

## 2023-11-25 DIAGNOSIS — Z794 Long term (current) use of insulin: Secondary | ICD-10-CM | POA: Diagnosis not present

## 2023-11-25 DIAGNOSIS — E0865 Diabetes mellitus due to underlying condition with hyperglycemia: Secondary | ICD-10-CM | POA: Diagnosis not present

## 2023-11-25 LAB — GLUCOSE, CAPILLARY: Glucose-Capillary: 250 mg/dL — ABNORMAL HIGH (ref 70–99)

## 2023-11-25 NOTE — Discharge Instructions (Addendum)
 Continue 25 units of long-acting insulin /Tresiba .  Stop by the pharmacy to update your insurance and pick up your Farxiga .  Schedule a follow-up appointment with Dr. Chandra, your primary care doctor.  On brief review of your ultrasound, there were not any interruptions in the blood flow.  The radiologist has not yet read your ultrasound but if they find a blood clot, I will call you know.

## 2023-11-25 NOTE — ED Triage Notes (Signed)
 Patient states that she noticed left ankle swelling last night.  Patient denies any pain.  Patient denies injury or fall.

## 2023-11-25 NOTE — ED Provider Notes (Signed)
 [facility]    CSN: [id number] Arrival date & time: 11/25/23  1010      History   Chief Complaint Chief Complaint  Patient presents with   Ankle Pain    left    HPI Kristin Cruz is a 63 y.o. female.   HPI   Kristin Cruz presents for left ankle swelling that she noticed last night.  Denies pain, injury, falls or trauma.  She works third shift.  No calf pain or swelling.  No history of blood clots in her legs or lungs.  Notes that her blood sugars have been reading  high.  She has been taking her Tresiba  at 20-25 units daily.  Says her primary care doctor has not refilled her Farxiga  for some reason.       Past Medical History:  Diagnosis Date   Diabetes mellitus without complication (HCC)    GERD (gastroesophageal reflux disease)    High cholesterol     Patient Active Problem List   Diagnosis Date Noted   Imbalance 09/14/2023   COVID-19 06/29/2023   Polyp of ascending colon 05/23/2023   Encounter for screening colonoscopy 05/23/2023   Numbness and tingling of both feet 04/17/2023   Vaginal candida 04/17/2023   Subclinical hypothyroidism 02/05/2023   Moderate episode of recurrent major depressive disorder (HCC) 06/02/2022   Hypertrophic toenail 07/15/2020   Nonintractable headache 02/18/2020   Encounter for general adult medical examination with abnormal findings 11/24/2019   Routine cervical smear 11/24/2019   Dysuria 11/24/2019   Pain of left shoulder region 08/26/2019   Screening for breast cancer 07/18/2019   Gastroesophageal reflux disease without esophagitis 07/18/2019   Mixed hyperlipidemia 07/18/2019   Hyperlipidemia due to type 2 diabetes mellitus (HCC) 12/29/2017   Type 2 diabetes mellitus with diabetic polyneuropathy, with long-term current use of insulin  (HCC) 12/29/2017   Vitamin D  deficiency 12/29/2017    Past Surgical History:  Procedure Laterality Date   COLONOSCOPY     COLONOSCOPY WITH PROPOFOL  N/A 05/23/2023    Procedure: COLONOSCOPY WITH BIOPSY;  Surgeon: Jinny Carmine, MD;  Location: [facility];  Service: Endoscopy;  Laterality: N/A;  Diabetic   POLYPECTOMY N/A 05/23/2023   Procedure: POLYPECTOMY;  Surgeon: Jinny Carmine, MD;  Location: [facility];  Service: Endoscopy;  Laterality: N/A;    OB History   No obstetric history on file.      Home Medications    Prior to Admission medications   Medication Sig Start Date End Date Taking? Authorizing Provider  Accu-Chek FastClix Lancets MISC Use as directed to check sugars twice daily. Dx e11.65 04/16/22   Kristina Tinnie POUR, PA-C  aspirin  81 MG chewable tablet Chew by mouth.    [provider]  Continuous Glucose Sensor (FREESTYLE LIBRE 3 SENSOR) MISC Place 1 sensor on the skin every 14 days. Use to check glucose continuously 09/14/23   Chandra Toribio POUR, MD  dapagliflozin  propanediol (FARXIGA ) 10 MG TABS tablet Take 1 tablet (10 mg total) by mouth daily before breakfast. 09/14/23   Chandra Toribio POUR, MD  escitalopram  (LEXAPRO ) 5 MG tablet Take 1 tablet (5 mg total) by mouth daily. Patient not taking: Reported on 05/17/2023 06/02/22   Boscia, Heather E, NP  glucose blood (ACCU-CHEK GUIDE) test strip Use as instructed twice a day diag E11.65 04/16/22   McDonough, Tinnie POUR, PA-C  insulin  degludec (TRESIBA  FLEXTOUCH) 100 UNIT/ML FlexTouch Pen Inject 30 Units into the skin daily. 04/06/23   Boscia, Heather E, NP  Insulin  Pen  Needle (PEN NEEDLES) 32G X 4 MM MISC 1 each by Does not apply route daily. To use with insulin  dosing up to three times daily as needed.  E11.9 04/16/22   McDonough, Lauren K, PA-C  Na Sulfate-K Sulfate-Mg Sulf 17.5-3.13-1.6 GM/177ML SOLN SMARTSIG:1 Kit(s) By Mouth Once 05/19/23   [provider]  omeprazole  (PRILOSEC) 40 MG capsule TAKE 1 TABLET ONCE A DAY 30 MINUTES BEFORE MEAL 04/30/21   McDonough, Lauren K, PA-C  rosuvastatin  (CRESTOR ) 10 MG tablet Take 1 tablet (10 mg total) by mouth daily. 04/06/23   Hanford Powell BRAVO, NP  Vitamin D , Ergocalciferol , (DRISDOL ) 1.25 MG (50000 UNIT) CAPS capsule TAKE ONE CAPSULE BY MOUTH WEEKLY FOR LOW VITAMIN D  04/30/21   McDonough, Tinnie POUR, PA-C    Family History Family History  Problem Relation Age of Onset   Cancer Mother    Diabetes Mother    Alcohol abuse Son    Cancer Maternal Aunt    Breast cancer Neg Hx     Social History Social History   Tobacco Use   Smoking status: Never    Passive exposure: Never   Smokeless tobacco: Current    Types: Snuff  Vaping Use   Vaping status: Never Used  Substance Use Topics   Alcohol use: Not Currently   Drug use: Never     Allergies   Patient has no known allergies.   Review of Systems Review of Systems: negative unless otherwise stated in HPI.      Physical Exam Triage Vital Signs ED Triage Vitals  Encounter Vitals Group     BP 11/25/23 1041 (!) 156/77     Systolic BP Percentile --      Diastolic BP Percentile --      Pulse Rate 11/25/23 1041 86     Resp 11/25/23 1041 14     Temp 11/25/23 1041 98.1 F (36.7 C)     Temp Source 11/25/23 1041 Oral     SpO2 11/25/23 1041 100 %     Weight 11/25/23 1040 133 lb 6.1 oz (60.5 kg)     Height 11/25/23 1040 5' 1.5 (1.562 m)     Head Circumference --      Peak Flow --      Pain Score 11/25/23 1040 0     Pain Loc --      Pain Education --      Exclude from Growth Chart --    No data found.  Updated Vital Signs BP (!) 156/77 (BP Location: Left Arm)   Pulse 86   Temp 98.1 F (36.7 C) (Oral)   Resp 14   Ht 5' 1.5 (1.562 m)   Wt 60.5 kg   SpO2 100%   BMI 24.79 kg/m   Visual Acuity Right Eye Distance:   Left Eye Distance:   Bilateral Distance:    Right Eye Near:   Left Eye Near:    Bilateral Near:     Physical Exam GEN:     alert, well appearing older female in no distress    RESP:  no increased work of breathing  EXT:   normal ROM, atraumatic, non-tender, LLE edema from mid shin to ankle left ankle: Inspection: No erythema,  ecchymosis or bony deformity, transverse and medial arches intact Palpation:  no TTP ROM: Full active and passive range of motion Strength: 5/5 in all directions No ligamentous laxity No pain at the base of the fifth metatarsal Able to ambulate  without pain  Neurovascularly  intact, no instability noted Skin:   warm and dry, no rash, normal skin turgor      UC Treatments / Results  Labs (all labs ordered are listed, but only abnormal results are displayed) Labs Reviewed  GLUCOSE, CAPILLARY - Abnormal; Notable for the following components:      Result Value   Glucose-Capillary 250 (*)    All other components within normal limits  CBG MONITORING, ED    EKG  If EKG performed, see my interpretation in the MDM section  Radiology US  Venous Img Lower Unilateral Left Result Date: 11/25/2023 CLINICAL DATA:  Left lower extremity edema.  Evaluate for DVT. EXAM: LEFT LOWER EXTREMITY VENOUS DOPPLER ULTRASOUND TECHNIQUE: Gray-scale sonography with graded compression, as well as color Doppler and duplex ultrasound were performed to evaluate the lower extremity deep venous systems from the level of the common femoral vein and including the common femoral, femoral, profunda femoral, popliteal and calf veins including the posterior tibial, peroneal and gastrocnemius veins when visible. The superficial great saphenous vein was also interrogated. Spectral Doppler was utilized to evaluate flow at rest and with distal augmentation maneuvers in the common femoral, femoral and popliteal veins. COMPARISON:  None Available. FINDINGS: Contralateral Common Femoral Vein: Respiratory phasicity is normal and symmetric with the symptomatic side. No evidence of thrombus. Normal compressibility. Common Femoral Vein: No evidence of thrombus. Normal compressibility, respiratory phasicity and response to augmentation. Saphenofemoral Junction: No evidence of thrombus. Normal compressibility and flow on color Doppler  imaging. Profunda Femoral Vein: No evidence of thrombus. Normal compressibility and flow on color Doppler imaging. Femoral Vein: No evidence of thrombus. Normal compressibility, respiratory phasicity and response to augmentation. Popliteal Vein: No evidence of thrombus. Normal compressibility, respiratory phasicity and response to augmentation. Calf Veins: No evidence of thrombus. Normal compressibility and flow on color Doppler imaging. Superficial Great Saphenous Vein: No evidence of thrombus. Normal compressibility. Other Findings:  None. IMPRESSION: No evidence of DVT within the left lower extremity. Electronically Signed   By: Norleen Roulette M.D.   On: 11/25/2023 12:28     Procedures Procedures (including critical care time)  Medications Ordered in UC Medications - No data to display  Initial Impression / Assessment and Plan / UC Course  I have reviewed the triage vital signs and the nursing notes.  Pertinent labs & imaging results that were available during my care of the patient were reviewed by me and considered in my medical decision making (see chart for details).       Patient is a 64 y.o. female  who presents for unilateral leg edema.  Overall patient is nontoxic-appearing and afebrile.  Vital signs stable.  She is hypertensive at 156/77.  She has unilateral left lower leg edema without pain or injury.  She has no calf tenderness on exam.  Recommended stat DVT ultrasound with Doppler and she is agreeable.   Type 2 diabetes: CBG obtained and was 250 here.  She is reporting high blood pressures at home.  Has been taking 20-25 of her long-acting insulin .  On chart review, she is seen by Dr. Chandra who represcribed her Farxiga  though she has not taken this medication.  She has no insurance and will stop at the pharmacy to see if they have this medication for her.  She should continue on 25 units of long-acting insulin  unless her blood sugar drops below 250 fasting.    ED and return  precautions given and patient/guardian voiced understanding. Discussed MDM, treatment plan and plan for  follow-up with patient who agrees with plan.   There is no DVT seen on ultrasound.  Final Clinical Impressions(s) / UC Diagnoses   Final diagnoses:  Left leg swelling  Diabetes mellitus due to underlying condition with hyperglycemia, with long-term current use of insulin  (HCC)     Discharge Instructions      Continue 25 units of long-acting insulin /Tresiba .  Stop by the pharmacy to update your insurance and pick up your Farxiga .  Schedule a follow-up appointment with Dr. Chandra, your primary care doctor.  On brief review of your ultrasound, there were not any interruptions in the blood flow.  The radiologist has not yet read your ultrasound but if they find a blood clot, I will call you know.       ED Prescriptions   None    PDMP not reviewed this encounter.   Kriste Berth, DO 11/25/23 1234

## 2023-11-28 ENCOUNTER — Ambulatory Visit: Payer: PRIVATE HEALTH INSURANCE | Admitting: Family Medicine

## 2023-11-28 NOTE — Progress Notes (Deleted)
   Established Patient Office Visit  Subjective   Patient ID: Kristin Cruz, female    DOB: Nov 09, 1961  Age: 63 y.o. MRN: [id number]  No chief complaint on file.   HPI Kristin Cruz is a 63 y.o. female presenting today for follow up of ***.  Outpatient Medications Prior to Visit  Medication Sig   Accu-Chek FastClix Lancets MISC Use as directed to check sugars twice daily. Dx e11.65   aspirin  81 MG chewable tablet Chew by mouth.   Continuous Glucose Sensor (FREESTYLE LIBRE 3 SENSOR) MISC Place 1 sensor on the skin every 14 days. Use to check glucose continuously   dapagliflozin  propanediol (FARXIGA ) 10 MG TABS tablet Take 1 tablet (10 mg total) by mouth daily before breakfast.   escitalopram  (LEXAPRO ) 5 MG tablet Take 1 tablet (5 mg total) by mouth daily. (Patient not taking: Reported on 05/17/2023)   glucose blood (ACCU-CHEK GUIDE) test strip Use as instructed twice a day diag E11.65   insulin  degludec (TRESIBA  FLEXTOUCH) 100 UNIT/ML FlexTouch Pen Inject 30 Units into the skin daily.   Insulin  Pen Needle (PEN NEEDLES) 32G X 4 MM MISC 1 each by Does not apply route daily. To use with insulin  dosing up to three times daily as needed.  E11.9   Na Sulfate-K Sulfate-Mg Sulf 17.5-3.13-1.6 GM/177ML SOLN SMARTSIG:1 Kit(s) By Mouth Once   omeprazole  (PRILOSEC) 40 MG capsule TAKE 1 TABLET ONCE A DAY 30 MINUTES BEFORE MEAL   rosuvastatin  (CRESTOR ) 10 MG tablet Take 1 tablet (10 mg total) by mouth daily.   Vitamin D , Ergocalciferol , (DRISDOL ) 1.25 MG (50000 UNIT) CAPS capsule TAKE ONE CAPSULE BY MOUTH WEEKLY FOR LOW VITAMIN D    No facility-administered medications prior to visit.    ROS Negative unless otherwise noted in HPI   Objective:     There were no vitals taken for this visit.  Physical Exam   No results found for any visits on 11/28/23.   Assessment & Plan:  There are no diagnoses linked to this encounter.  No follow-ups on file.    Joesph DELENA Sear, PA

## 2023-12-22 ENCOUNTER — Encounter: Payer: Self-pay | Admitting: Family Medicine

## 2024-04-02 ENCOUNTER — Ambulatory Visit: Payer: Self-pay

## 2024-04-02 NOTE — Telephone Encounter (Signed)
 Copied from CRM [id number]. Topic: Clinical - Red Word Triage >> Apr 02, 2024 11:36 AM Crispin Dolphin wrote: Red Word that prompted transfer to Nurse Triage: Left Leg Swelling   Chief Complaint: Leg swelling Symptoms: Left leg swelling  Frequency: Constant  Pertinent Negatives: Patient denies redness to leg  Disposition: [] ED /[x] Urgent Care (no appt availability in office) / [] Appointment(In office/virtual)/ []  [facility]/ [] Home Care/ [] Refused Recommended Disposition /[] [facility]/ []  Follow-up with PCP Additional Notes: Patient's daughter called to report that the patient has had left leg swelling for the last 1-2 weeks. She states that the patient denies any pain to her leg and that it does not appear to have any redness. I advised that with the patient's symptoms and with no appointment available in the office today that the patient should go to urgent care or the ED for evaluation of her left leg swelling. She understood and is agreeable with this plan.     Reason for Disposition  [1] Thigh, calf, or ankle swelling AND [2] only 1 side  Answer Assessment - Initial Assessment Questions 1. ONSET: "When did the swelling start?" (e.g., minutes, hours, days)     A couple of weeks 2. LOCATION: "What part of the leg is swollen?"  "Are both legs swollen or just one leg?"     Left leg  3. SEVERITY: "How bad is the swelling?" (e.g., localized; mild, moderate, severe)   - Localized: Small area of swelling localized to one leg.   - MILD pedal edema: Swelling limited to foot and ankle, pitting edema < 1/4 inch (6 mm) deep, rest and elevation eliminate most or all swelling.   - MODERATE edema: Swelling of lower leg to knee, pitting edema > 1/4 inch (6 mm) deep, rest and elevation only partially reduce swelling.   - SEVERE edema: Swelling extends above knee, facial or hand swelling present.      Moderate  4. REDNESS: "Does the swelling look red or infected?"     No 5. PAIN: "Is  the swelling painful to touch?" If Yes, ask: "How painful is it?"   (Scale 1-10; mild, moderate or severe)     No 6. FEVER: "Do you have a fever?" If Yes, ask: "What is it, how was it measured, and when did it start?"      No 7. CAUSE: "What do you think is causing the leg swelling?"     Unsure  8. MEDICAL HISTORY: "Do you have a history of blood clots (e.g., DVT), cancer, heart failure, kidney disease, or liver failure?"     No 9. RECURRENT SYMPTOM: "Have you had leg swelling before?" If Yes, ask: "When was the last time?" "What happened that time?"     No 10. OTHER SYMPTOMS: "Do you have any other symptoms?" (e.g., chest pain, difficulty breathing)       Fatigue  Protocols used: Leg Swelling and Edema-A-AH

## 2024-04-11 ENCOUNTER — Other Ambulatory Visit: Payer: Self-pay | Admitting: Nurse Practitioner

## 2024-04-11 DIAGNOSIS — E1165 Type 2 diabetes mellitus with hyperglycemia: Secondary | ICD-10-CM

## 2024-04-30 ENCOUNTER — Other Ambulatory Visit: Payer: Self-pay | Admitting: Nurse Practitioner

## 2024-04-30 DIAGNOSIS — E1165 Type 2 diabetes mellitus with hyperglycemia: Secondary | ICD-10-CM

## 2024-05-02 ENCOUNTER — Encounter: Payer: Self-pay | Admitting: Family Medicine

## 2024-05-02 ENCOUNTER — Telehealth: Payer: Self-pay | Admitting: Family Medicine

## 2024-05-02 ENCOUNTER — Ambulatory Visit: Admitting: Family Medicine

## 2024-05-02 VITALS — BP 89/62 | HR 95 | Temp 97.8°F | Wt 120.0 lb

## 2024-05-02 DIAGNOSIS — Z0000 Encounter for general adult medical examination without abnormal findings: Secondary | ICD-10-CM

## 2024-05-02 DIAGNOSIS — E1169 Type 2 diabetes mellitus with other specified complication: Secondary | ICD-10-CM | POA: Insufficient documentation

## 2024-05-02 DIAGNOSIS — E1165 Type 2 diabetes mellitus with hyperglycemia: Secondary | ICD-10-CM | POA: Insufficient documentation

## 2024-05-02 DIAGNOSIS — E861 Hypovolemia: Secondary | ICD-10-CM | POA: Insufficient documentation

## 2024-05-02 DIAGNOSIS — Z7689 Persons encountering health services in other specified circumstances: Secondary | ICD-10-CM

## 2024-05-02 DIAGNOSIS — Z794 Long term (current) use of insulin: Secondary | ICD-10-CM | POA: Diagnosis not present

## 2024-05-02 DIAGNOSIS — E785 Hyperlipidemia, unspecified: Secondary | ICD-10-CM

## 2024-05-02 LAB — POCT GLYCOSYLATED HEMOGLOBIN (HGB A1C): Hemoglobin A1C: 11.4 % — AB (ref 4.0–5.6)

## 2024-05-02 MED ORDER — ROSUVASTATIN CALCIUM 10 MG PO TABS: 10.0000 mg | ORAL_TABLET | Freq: Every day | ORAL | 3 refills | Status: DC

## 2024-05-02 MED ORDER — DEXCOM G6 RECEIVER DEVI
1.0000 | Freq: Once | 0 refills | Status: AC
Start: 2024-05-02 — End: 2024-05-02

## 2024-05-02 MED ORDER — TRESIBA FLEXTOUCH 100 UNIT/ML ~~LOC~~ SOPN: 10.0000 [IU] | PEN_INJECTOR | Freq: Every evening | SUBCUTANEOUS | 5 refills | Status: DC

## 2024-05-02 MED ORDER — DEXCOM G6 TRANSMITTER MISC
1.0000 | Freq: Once | 0 refills | Status: AC
Start: 2024-05-02 — End: 2024-05-02

## 2024-05-02 MED ORDER — BLOOD GLUCOSE TEST VI STRP
1.0000 | ORAL_STRIP | Freq: Three times a day (TID) | 99 refills | Status: AC
Start: 2024-05-02 — End: 2024-06-01

## 2024-05-02 MED ORDER — DEXCOM G6 SENSOR MISC
3 refills | Status: DC
Start: 2024-05-02 — End: 2024-06-06

## 2024-05-02 NOTE — Telephone Encounter (Signed)
 I will call [employer] tomorrow morning to ask for patient's medication: [place] if it needs prior-authorization.

## 2024-05-02 NOTE — Patient Instructions (Addendum)
 [facility] INSULIN INSTRUCTIONS  Check your blood sugar every day. It would be best if you could keep a notebook of dates, blood sugar values, and how much insulin you use. Bring this to your next visit.   If your blood sugar is less than 100, subtract 2 units from the previous day's dose.   If your sugar is between 100-200, you will give yourself the same dose of insulin as the previous day.   If your blood sugar is over 200, you should add 2 units to your previous day's dose.   - For Example: Yesterday you gave yourself 20 units of Insulin. Today your blood sugar is 235. You should give yourself 22 units today. If the next day your sugar is 215, you would give yourself 24 units total.     Sugar    Insulin Dosing  0-100    Subtract 2 units from previous days' dose 101-200   Keep the same dose as the previous day 200 or more   Add 2 units to the previous days dose.      Adjust your dose every 4 days, based on your fasting readings.      MyChart:  For all urgent or time sensitive needs we ask that you please call the office to avoid delays. Our number is [phone]) [id number]. MyChart is not constantly monitored and due to the large volume of messages a day, replies may take up to 72 business hours.   MyChart Policy: MyChart allows for you to see your visit notes, after visit summary, provider recommendations, lab and tests results, make an appointment, request refills, and contact your provider or the office for non-urgent questions or concerns. Providers are seeing patients during normal business hours and do not have built in time to review MyChart messages.  We ask that you allow a minimum of 3 business days for responses to [employer]. For this reason, please do not send urgent requests through MyChart. Please call the office at [phone]. New and ongoing conditions may require a visit. We have virtual and in person visit available for your convenience.  Complex MyChart  concerns may require a visit. Your provider may request you schedule a virtual or in person visit to ensure we are providing the best care possible. MyChart messages sent after 11:00 AM on Friday will not be received by the provider until Monday morning.    Lab and Test Results: You will receive your lab and test results on MyChart as soon as they are completed and results have been sent by the lab or testing facility. Due to this service, you will receive your results BEFORE your provider.  I review lab and tests results each morning prior to seeing patients. Some results require collaboration with other providers to ensure you are receiving the most appropriate care. For this reason, we ask that you please allow a minimum of 3-5 business days from the time the ALL results have been received for your provider to receive and review lab and test results and contact you about these.  Most lab and test result comments from the provider will be sent through MyChart. Your provider may recommend changes to the plan of care, follow-up visits, repeat testing, ask questions, or request an office visit to discuss these results. You may reply directly to this message or call the office at [phone] to provide information for the provider or set up an appointment. In some instances, you will be called with test results  and recommendations. Please let us  know if this is preferred and we will make note of this in your chart to provide this for you.    If you have not heard a response to your lab or test results in 5 business days from all results returning to MyChart, please call the office to let us  know. We ask that you please avoid calling prior to this time unless there is an emergent concern. Due to high call volumes, this can delay the resulting process.   After Hours: For all non-emergency after hours needs, please call the office at [phone] and select the option to reach the on-call provider service.  On-call services are shared between multiple [facility] offices and therefore it will not be possible to speak directly with your provider. On-call providers may provide medical advice and recommendations, but are unable to provide refills for maintenance medications.  For all emergency or urgent medical needs after normal business hours, we recommend that you seek care at the closest Urgent Care or Emergency Department to ensure appropriate treatment in a timely manner.  [facility] has a 24 hour emergency room located on the ground floor for your convenience.    Urgent Concerns During the Business Day Providers are seeing patients from 8AM to 5PM, Monday through Thursday, and 8AM to 12PM on Friday with a busy schedule and are most often not able to respond to non-urgent calls until the end of the day or the next business day. If you should have URGENT concerns during the day, please call and speak to the nurse or schedule a same day appointment so that we can address your concern without delay.    Thank you, again, for choosing me as your health care partner. I appreciate your trust and look forward to learning more about you.    Evalene Arts, FNP-C

## 2024-05-02 NOTE — Telephone Encounter (Signed)
 Copied from CRM [id number]. Topic: Clinical - Medication Prior Auth >> May 02, 2024 12:39 PM Kristin Cruz wrote: Reason for CRM: Spoke with Marjorie with [employer] who is calling in to inform the provider of a medication needing a prior authorization.    insulin degludec (TRESIBA FLEXTOUCH) 100 UNIT/ML FlexTouch Pen

## 2024-05-02 NOTE — Progress Notes (Signed)
 New Patient Office Visit  Subjective   Patient ID: Kristin Cruz, female    DOB: 08/22/1961  Age: 63 y.o. MRN: [id number]  CC:  Chief Complaint  Patient presents with   Establish Care   Diabetes    HPI Kristin Cruz is a 63 year old female who presents to establish with [facility] at [facility]   CC: Patient here to establish care  Last PCP: [facility] in [place] (?)- nothing in chart  She reports she has seen Powell Lessen, NP  Specialists: none  PMHx: DM2, depression/anxiety  She works 3rd shift   DM2: Doreen-- gave her diarrhea  Tresiba 20 units at bedtime  Fasting blood sugar would be maybe 80-100 She took Jardiance in the past and tolerated it  Crestor 10mg - has been taking  Has not been taking her insulin for about the past month  A1c is 11.4% today       05/02/2024   11:48 AM  GAD 7 : Generalized Anxiety Score  Nervous, Anxious, on Edge 2  Control/stop worrying 2  Worry too much - different things 3  Trouble relaxing 2  Restless 0  Easily annoyed or irritable 2  Afraid - awful might happen 1  Total GAD 7 Score 12  Anxiety Difficulty Somewhat difficult       05/02/2024   11:48 AM  PHQ9 SCORE ONLY  PHQ-9 Total Score 22    Outpatient Encounter Medications as of 05/02/2024  Medication Sig   Glucose Blood (BLOOD GLUCOSE TEST STRIPS) STRP 1 each by In Vitro route in the morning, at noon, and at bedtime. May substitute to any manufacturer covered by patient's insurance. Patient is using Accu-Chek Guide Test Strips   [DISCONTINUED] rosuvastatin (CRESTOR) 10 MG tablet Take 10 mg by mouth daily.   insulin degludec (TRESIBA FLEXTOUCH) 100 UNIT/ML FlexTouch Pen Inject 10 Units into the skin at bedtime.   rosuvastatin (CRESTOR) 10 MG tablet Take 1 tablet (10 mg total) by mouth daily.   [DISCONTINUED] FARXIGA 10 MG TABS tablet Take 10 mg by mouth every morning. (Patient not taking: Reported on 05/02/2024)   [DISCONTINUED] insulin degludec (TRESIBA  FLEXTOUCH) 100 UNIT/ML FlexTouch Pen Inject 20 Units into the skin at bedtime. (Patient not taking: Reported on 05/02/2024)   No facility-administered encounter medications on file as of 05/02/2024.    There are no active problems to display for this patient.  Past Medical History:  Diagnosis Date   Depression    Diabetes mellitus without complication (HCC)    History reviewed. No pertinent surgical history. Family History  Problem Relation Age of Onset   Diabetes Mother    Social History   Socioeconomic History   Marital status: Legally Separated    Spouse name: Not on file   Number of children: Not on file   Years of education: Not on file   Highest education level: Not on file  Occupational History   Not on file  Tobacco Use   Smoking status: Never   Smokeless tobacco: Not on file  Vaping Use   Vaping status: Never Used  Substance and Sexual Activity   Alcohol use: Not Currently    Comment: Quit many years ago   Drug use: Yes    Types: Marijuana    Comment: Tried dry   Sexual activity: Not Currently  Other Topics Concern   Not on file  Social History Narrative   Not on file   Social Drivers of Health   Financial Resource Strain:  Not on file  Food Insecurity: Not on file  Transportation Needs: Not on file  Physical Activity: Not on file  Stress: Not on file  Social Connections: Not on file  Intimate Partner Violence: Not on file   Outpatient Medications Prior to Visit  Medication Sig Dispense Refill   rosuvastatin (CRESTOR) 10 MG tablet Take 10 mg by mouth daily.     FARXIGA 10 MG TABS tablet Take 10 mg by mouth every morning. (Patient not taking: Reported on 05/02/2024)     insulin degludec (TRESIBA FLEXTOUCH) 100 UNIT/ML FlexTouch Pen Inject 20 Units into the skin at bedtime. (Patient not taking: Reported on 05/02/2024)     No facility-administered medications prior to visit.   No Known Allergies  ROS: see HPI     Objective  Today's Vitals    05/02/24 1050  BP: (!) 89/62  Pulse: 95  Temp: 97.8 F (36.6 C)  TempSrc: Oral  SpO2: 97%  Weight: 120 lb (54.4 kg)    GENERAL: Well-appearing, in NAD. Well nourished.  SKIN: Pink, warm and dry. No rash, lesion, ulceration, or ecchymoses.  Head: Normocephalic. NECK: Trachea midline. Full ROM w/o pain or tenderness. No lymphadenopathy.  EARS: Tympanic membranes are intact, translucent without bulging and without drainage. Appropriate landmarks visualized.  EYES: Conjunctiva clear without exudates. EOMI, PERRL, no drainage present.  NOSE: Septum midline w/o deformity. Nares patent, mucosa pink and non-inflamed w/o drainage. No sinus tenderness.  THROAT: Uvula midline. Oropharynx clear. Tonsils non-inflamed without exudate. Mucous membranes pink and moist.  RESPIRATORY: Chest wall symmetrical. Respirations even and non-labored. Breath sounds clear to auscultation bilaterally.  CARDIAC: S1, S2 present, regular rate and rhythm without murmur or gallops. Peripheral pulses 2+ bilaterally.  MSK: Muscle tone and strength appropriate for age. Joints w/o tenderness, redness, or swelling.  EXTREMITIES: Without clubbing, cyanosis, or edema.  NEUROLOGIC: No motor or sensory deficits. Steady, even gait. C2-C12 intact.  PSYCH/MENTAL STATUS: Alert, oriented x 3. Cooperative, appropriate mood and affect.       Assessment & Plan:   1. Encounter to establish care (Primary) Patient is a 63- year-old female who presents today to establish care with primary care at [facility] Reviewed the past medical history, family history, social history, surgical history, medications and allergies today- updates made as indicated. Patient has concerns today about uncontrolled diabetes.    2. Uncontrolled diabetes mellitus with hyperglycemia, with long-term current use of insulin (HCC) POCT hemoglobin A1c completed today with results of 11.4%. Discussed restarting long-acting insulin [place]) at 10 units at bedtime  with patient. Advised patient to monitor fasting blood sugars and, if fasting blood sugars are 200 mg/dL or above, advised patient to increase by 2 units every 4 days. Will complete urine microalbumin-creatinine ratio. Patient was on Farxiga in the past and did not tolerate this medication. Will start with [place] and see how fasting blood sugars are. Advised patient to monitor fasting blood sugars and report any episodes of hypoglycemia. Rx sent to pharmacy on file. Patient would benefit from CGM. Will send to pharmacy on file.  - insulin degludec (TRESIBA FLEXTOUCH) 100 UNIT/ML FlexTouch Pen; Inject 10 Units into the skin at bedtime.  Dispense: 3 mL; Refill: 5 - Urine microalbumin-creatinine with uACR - POCT HgB A1C  3. Hyperlipidemia associated with type 2 diabetes mellitus (HCC) Patient has a history of hyperlipidemia. Currently taking Crestor 10mg  daily. Will check fasting lipid panel today.  - insulin degludec (TRESIBA FLEXTOUCH) 100 UNIT/ML FlexTouch Pen; Inject 10 Units into the skin  at bedtime.  Dispense: 3 mL; Refill: 5 - rosuvastatin (CRESTOR) 10 MG tablet; Take 1 tablet (10 mg total) by mouth daily.  Dispense: 90 tablet; Refill: 3  4. Hypotension due to hypovolemia Blood pressure 89/62. Denies lightheadedness, dizziness, syncopal episodes, heart palpitations, chest pain, and shortness of breath. Patient reports she has chronic low blood pressure- unable to review previous medical records. Advised patient to remain hydrated and to return to office if she becomes symptomatic.   5. Wellness examination Will complete fasting labs today (as patient works nightshift and has not eaten today).  - CBC with Differential/Platelet - Comprehensive metabolic panel with GFR - Lipid panel - TSH Rfx on Abnormal to Free T4   Return in about 4 weeks (around 05/30/2024) for chronic conditions .   Evalene Arts, FNP

## 2024-05-03 ENCOUNTER — Encounter: Payer: Self-pay | Admitting: Emergency Medicine

## 2024-05-03 LAB — CBC WITH DIFFERENTIAL/PLATELET
Basophils Absolute: 0 10*3/uL (ref 0.0–0.2)
Basos: 1 %
EOS (ABSOLUTE): 0.1 10*3/uL (ref 0.0–0.4)
Eos: 3 %
Hematocrit: 38.2 % (ref 34.0–46.6)
Hemoglobin: 11.9 g/dL (ref 11.1–15.9)
Immature Grans (Abs): 0 10*3/uL (ref 0.0–0.1)
Immature Granulocytes: 0 %
Lymphocytes Absolute: 1.1 10*3/uL (ref 0.7–3.1)
Lymphs: 31 %
MCH: 29.3 pg (ref 26.6–33.0)
MCHC: 31.2 g/dL — ABNORMAL LOW (ref 31.5–35.7)
MCV: 94 fL (ref 79–97)
Monocytes Absolute: 0.3 10*3/uL (ref 0.1–0.9)
Monocytes: 8 %
Neutrophils Absolute: 2.1 10*3/uL (ref 1.4–7.0)
Neutrophils: 57 %
Platelets: 186 10*3/uL (ref 150–450)
RBC: 4.06 x10E6/uL (ref 3.77–5.28)
RDW: 11 % — ABNORMAL LOW (ref 11.7–15.4)
WBC: 3.7 10*3/uL (ref 3.4–10.8)

## 2024-05-03 LAB — COMPREHENSIVE METABOLIC PANEL WITH GFR
ALT: 25 IU/L (ref 0–32)
AST: 21 IU/L (ref 0–40)
Albumin: 4.3 g/dL (ref 3.9–4.9)
Alkaline Phosphatase: 119 IU/L (ref 44–121)
BUN/Creatinine Ratio: 18 (ref 12–28)
BUN: 19 mg/dL (ref 8–27)
Bilirubin Total: 0.9 mg/dL (ref 0.0–1.2)
CO2: 18 mmol/L — ABNORMAL LOW (ref 20–29)
Calcium: 9.6 mg/dL (ref 8.7–10.3)
Chloride: 95 mmol/L — ABNORMAL LOW (ref 96–106)
Creatinine, Ser: 1.03 mg/dL — ABNORMAL HIGH (ref 0.57–1.00)
Globulin, Total: 2.5 g/dL (ref 1.5–4.5)
Sodium: 130 mmol/L — ABNORMAL LOW (ref 134–144)
Total Protein: 6.8 g/dL (ref 6.0–8.5)
eGFR: 61 mL/min/{1.73_m2} (ref >59)

## 2024-05-03 LAB — MICROALBUMIN / CREATININE URINE RATIO
Creatinine, Urine: 27.8 mg/dL
Microalb/Creat Ratio: 15 mg/g{creat} (ref 0–29)
Microalbumin, Urine: 4.1 ug/mL

## 2024-05-03 LAB — LIPID PANEL
Chol/HDL Ratio: 3 ratio (ref 0.0–4.4)
Cholesterol, Total: 167 mg/dL (ref 100–199)
HDL: 56 mg/dL (ref >39)
LDL Chol Calc (NIH): 87 mg/dL (ref 0–99)
Triglycerides: 136 mg/dL (ref 0–149)
VLDL Cholesterol Cal: 24 mg/dL (ref 5–40)

## 2024-05-03 LAB — TSH RFX ON ABNORMAL TO FREE T4: TSH: 1.54 u[IU]/mL (ref 0.450–4.500)

## 2024-05-07 ENCOUNTER — Ambulatory Visit: Payer: Self-pay | Admitting: Family Medicine

## 2024-05-07 DIAGNOSIS — E871 Hypo-osmolality and hyponatremia: Secondary | ICD-10-CM

## 2024-05-07 MED ORDER — SODIUM CHLORIDE 1 G PO TABS: 1.0000 g | ORAL_TABLET | Freq: Two times a day (BID) | ORAL | 0 refills | Status: AC

## 2024-05-14 NOTE — Progress Notes (Signed)
 Attempted to reach patient. Unable to leave vm. Will send mychart message and continue to try to reach patient.

## 2024-05-16 NOTE — Progress Notes (Signed)
 Left vm asking patient to call the office. I also sent another mychart message yesterday.

## 2024-06-01 ENCOUNTER — Ambulatory Visit: Admitting: Family Medicine

## 2024-06-06 ENCOUNTER — Encounter: Payer: Self-pay | Admitting: Family Medicine

## 2024-06-06 ENCOUNTER — Emergency Department
Admission: EM | Admit: 2024-06-06 | Discharge: 2024-06-06 | Disposition: A | Discharge status: Home / Self Care | Payer: PRIVATE HEALTH INSURANCE | Source: Ambulatory Visit | Attending: Emergency Medicine | Admitting: Emergency Medicine

## 2024-06-06 ENCOUNTER — Ambulatory Visit: Admitting: Family Medicine

## 2024-06-06 ENCOUNTER — Emergency Department: Payer: PRIVATE HEALTH INSURANCE

## 2024-06-06 ENCOUNTER — Other Ambulatory Visit: Payer: Self-pay

## 2024-06-06 VITALS — Resp 16 | Ht 61.5 in | Wt 123.0 lb

## 2024-06-06 DIAGNOSIS — I951 Orthostatic hypotension: Secondary | ICD-10-CM

## 2024-06-06 DIAGNOSIS — Z794 Long term (current) use of insulin: Secondary | ICD-10-CM

## 2024-06-06 DIAGNOSIS — E861 Hypovolemia: Secondary | ICD-10-CM

## 2024-06-06 DIAGNOSIS — E1142 Type 2 diabetes mellitus with diabetic polyneuropathy: Secondary | ICD-10-CM

## 2024-06-06 DIAGNOSIS — E860 Dehydration: Secondary | ICD-10-CM | POA: Diagnosis not present

## 2024-06-06 DIAGNOSIS — E1165 Type 2 diabetes mellitus with hyperglycemia: Secondary | ICD-10-CM | POA: Diagnosis not present

## 2024-06-06 DIAGNOSIS — I959 Hypotension, unspecified: Secondary | ICD-10-CM | POA: Diagnosis not present

## 2024-06-06 DIAGNOSIS — R079 Chest pain, unspecified: Secondary | ICD-10-CM | POA: Diagnosis not present

## 2024-06-06 DIAGNOSIS — E785 Hyperlipidemia, unspecified: Secondary | ICD-10-CM

## 2024-06-06 DIAGNOSIS — R197 Diarrhea, unspecified: Secondary | ICD-10-CM | POA: Insufficient documentation

## 2024-06-06 DIAGNOSIS — E871 Hypo-osmolality and hyponatremia: Secondary | ICD-10-CM

## 2024-06-06 DIAGNOSIS — R031 Nonspecific low blood-pressure reading: Secondary | ICD-10-CM

## 2024-06-06 DIAGNOSIS — E1169 Type 2 diabetes mellitus with other specified complication: Secondary | ICD-10-CM

## 2024-06-06 DIAGNOSIS — E119 Type 2 diabetes mellitus without complications: Secondary | ICD-10-CM | POA: Insufficient documentation

## 2024-06-06 DIAGNOSIS — I10 Essential (primary) hypertension: Secondary | ICD-10-CM | POA: Diagnosis not present

## 2024-06-06 LAB — HEPATIC FUNCTION PANEL
ALT: 31 U/L (ref 0–44)
AST: 30 U/L (ref 15–41)
Albumin: 4 g/dL (ref 3.5–5.0)
Alkaline Phosphatase: 112 U/L (ref 38–126)
Bilirubin, Direct: 0.2 mg/dL (ref 0.0–0.2)
Indirect Bilirubin: 0.9 mg/dL (ref 0.3–0.9)
Total Bilirubin: 1.1 mg/dL (ref 0.0–1.2)
Total Protein: 7.4 g/dL (ref 6.5–8.1)

## 2024-06-06 LAB — BASIC METABOLIC PANEL WITH GFR
Anion gap: 11 (ref 5–15)
BUN: 25 mg/dL — ABNORMAL HIGH (ref 8–23)
CO2: 24 mmol/L (ref 22–32)
Calcium: 9.5 mg/dL (ref 8.9–10.3)
Chloride: 101 mmol/L (ref 98–111)
Creatinine, Ser: 0.97 mg/dL (ref 0.44–1.00)
GFR, Estimated: >60 mL/min (ref >60)
Glucose, Bld: 491 mg/dL — ABNORMAL HIGH (ref 70–99)
Potassium: 3.9 mmol/L (ref 3.5–5.1)
Sodium: 136 mmol/L (ref 135–145)

## 2024-06-06 LAB — CBC
HCT: 31.7 % — ABNORMAL LOW (ref 36.0–46.0)
Hemoglobin: 10.7 g/dL — ABNORMAL LOW (ref 12.0–15.0)
MCH: 30.5 pg (ref 26.0–34.0)
MCHC: 33.8 g/dL (ref 30.0–36.0)
MCV: 90.3 fL (ref 80.0–100.0)
Platelets: 177 K/uL (ref 150–400)
RBC: 3.51 MIL/uL — ABNORMAL LOW (ref 3.87–5.11)
RDW: 11.9 % (ref 11.5–15.5)
WBC: 3.1 K/uL — ABNORMAL LOW (ref 4.0–10.5)
nRBC: 0 % (ref 0.0–0.2)

## 2024-06-06 LAB — TROPONIN I (HIGH SENSITIVITY): Troponin I (High Sensitivity): 3 ng/L (ref <18)

## 2024-06-06 LAB — LIPASE, BLOOD: Lipase: 30 U/L (ref 11–51)

## 2024-06-06 MED ORDER — FREESTYLE LIBRE 3 SENSOR MISC: 3 refills | Status: DC

## 2024-06-06 MED ORDER — PEN NEEDLES 32G X 4 MM MISC: 1.0000 | Freq: Every day | 5 refills | Status: AC

## 2024-06-06 MED ORDER — TRESIBA FLEXTOUCH 100 UNIT/ML ~~LOC~~ SOPN: 20.0000 [IU] | PEN_INJECTOR | Freq: Every day | SUBCUTANEOUS | 2 refills | Status: DC

## 2024-06-06 MED ORDER — DAPAGLIFLOZIN PROPANEDIOL 10 MG PO TABS: 10.0000 mg | ORAL_TABLET | Freq: Every day | ORAL | 1 refills | Status: DC

## 2024-06-06 MED ORDER — DEXCOM G6 SENSOR MISC: 3 refills | Status: DC

## 2024-06-06 MED ORDER — ASPIRIN 81 MG PO CHEW: 81.0000 mg | CHEWABLE_TABLET | Freq: Every day | ORAL | 2 refills | Status: AC

## 2024-06-06 MED ORDER — ROSUVASTATIN CALCIUM 10 MG PO TABS: 10.0000 mg | ORAL_TABLET | Freq: Every day | ORAL | 3 refills | Status: DC

## 2024-06-06 NOTE — ED Notes (Signed)
 Attending Orthostats: Lying 130/80 P85; Sit 121/76 P87; stand 95/61 P 99

## 2024-06-06 NOTE — Patient Instructions (Addendum)
 [facility] [address] [place], [place] [address] Phone: [phone]   85/57 sitting, 91/57 supine 71/46 standing     Smoking Cessation: QuitlineNC 1-800-QUIT-NOW (867)504-8233); Espaol: 1-855-Djelo-Ya (1-340-398-6455) [email or web address]

## 2024-06-06 NOTE — Progress Notes (Signed)
 Established Patient Office Visit  Subjective  Patient ID: Kristin Cruz, female    DOB: [birth year]  Age: 63 y.o. MRN: [id number]  Chief Complaint  Patient presents with   Follow-up   Kristin Cruz is a pleasant 63 year old female patient who presents today with follow-up for chronic conditions.She has a PMH of DM2, HLD and hypotension.   HYPOTENSION:  Patient reports some days I feel bad bad and other days I feel okay. She is unable to describe bad.  She reports, since her last OV, she feels a whole lot better. She reports that she often experiences dizziness about 2-3x per week- she reports that this happens at various times and is not associated with a change in position. She does report occasional shortness of breath with activity. Denies any current complaints. Denies fever/chills, chest pain, shob, syncope, weakness, dizziness, urinary symptoms, headache, peripheral edema. She reports she has adequate oral intake; she does report polyuria.  Last A1c checked recently was 11.4% on 6/18   ROS: see HPI    Objective:     Resp 16   Ht 5' 1.5 (1.562 m)   Wt 123 lb (55.8 kg)   SpO2 97%   BMI 22.86 kg/m  BP Readings from Last 3 Encounters:  06/06/24 116/69  05/02/24 (!) 89/62  11/25/23 (!) 156/77     Physical Exam Vitals reviewed.  Constitutional:      Appearance: Normal appearance.  Cardiovascular:     Rate and Rhythm: Normal rate and regular rhythm.     Pulses: Normal pulses.          Radial pulses are 2+ on the right side and 2+ on the left side.     Heart sounds: Normal heart sounds, S1 normal and S2 normal.  Pulmonary:     Effort: Pulmonary effort is normal.     Breath sounds: Normal breath sounds.  Musculoskeletal:     Right lower leg: No edema.     Left lower leg: No edema.  Neurological:     Mental Status: She is alert.  Psychiatric:        Mood and Affect: Mood normal.        Behavior: Behavior normal.     Assessment & Plan:   1.  Orthostatic hypotension (Primary) Patient is a pleasant 63 year old female patient who presents today for follow-up of chronic conditions. Initial blood pressure was hypotensive at 85/57 sitting. Patient denies any current symptoms- including dizziness, lightheadedness, shortness of breath, chest pain. Orthostatics are positive with blood pressure readings of 91/57 supine & 71/46 standing. She denies dizziness during this; however, she endorses feeling dizziness multiple times per week. Discussed emergency concerns with patient and advised patient to seek emergency care services. Patient declines ambulance transportation. She verbalized an understanding and plans to go to [facility] after this appointment.    2. Hypotension due to hypovolemia See  #1  3. Uncontrolled diabetes mellitus with hyperglycemia, with long-term current use of insulin  (HCC) Last A1c checked recently was 11.4% on 05/02/2024. Patient reports polyuria; denies polyphagia and polydipsia. Current medication regimen includes: Farxiga  10mg  daily and Tresiba  10 units at bedtime. She was previously on 30 units at bedtime but had been off of medication for a while. Discussed seeking care at ED and will make necessary adjustments to medication regimen based on her blood glucose reading. Will call patient by end of week if adjustments need to be made to insulin  regimen.   4. Hyponatremia Last sodium was 130  and potassium was undetermined. Concerns about acute electrolyte abnormalities with hypotension and uncontrolled type II diabetes mellitus. Advised patient to see care at ED for possible fluids, electrolyte replacement, and correction of elevated blood glucose. Patient is agreeable to plan.   Return in about 27 days (around 07/03/2024) for Diabetes f/u.    Evalene Arts, FNP

## 2024-06-06 NOTE — ED Provider Notes (Signed)
[facility] Emergency Department Provider Note     Event Date/Time   First MD Initiated Contact with Patient 06/06/24 1512     (approximate)   History   Dehydration   HPI  Kristin Cruz is a 63 y.o. female with a history of DM type II, HLD, GERD, and depression, presents to the ED at the advice of her PCP.  She apparently had a routine visit with her PCP, and was noted to have orthostatic hypotension with readings noted (85/57; 91/57; 71/46). She recently established with this new PCP.  She reported to the PCP that she had been experiencing some diarrhea over the past week, without abdominal pain. Patient denies any acute complaints at this time. No reports of any fevers, chills, sweats, chest pain, or shortness of breath.  Patient denies any dysuria, hematuria, urinary retention.  She also denies any syncope, weakness, or dizziness.  Patient denies any bright red blood per rectum, or melanotic stools.  She still endorses normal p.o. intake at this time.  She presents to the ED for evaluation.  The patient denies any acute complaints at the time of evaluation.  Patient apparently stopped and had a lunch meal between the time that she saw her PCP this morning at 9, and the time she presented to the ED for evaluation.  This meal included fried chicken and couple of coleslaw.  Patient endorses a history of some mild lactose intolerance, and believes that the 1 episode of loose stool she had prior to arrival was due to ingesting the coleslaw which was made with mayonnaise.  Physical Exam   Triage Vital Signs: ED Triage Vitals [06/06/24 1410]  Encounter Vitals Group     BP 116/69     Girls Systolic BP Percentile      Girls Diastolic BP Percentile      Boys Systolic BP Percentile      Boys Diastolic BP Percentile      Pulse Rate 96     Resp 17     Temp 98.6 F (37 C)     Temp Source Oral     SpO2 99 %     Weight 123 lb 0.3 oz (55.8 kg)     Height 5' 1  (1.549 m)     Head Circumference      Peak Flow      Pain Score 0     Pain Loc      Pain Education      Exclude from Growth Chart     Most recent vital signs: Vitals:   06/06/24 1410  BP: 116/69  Pulse: 96  Resp: 17  Temp: 98.6 F (37 C)  SpO2: 99%    General Awake, no distress. NAD HEENT NCAT. PERRL. EOMI. No rhinorrhea. Mucous membranes are moist. CV:  Good peripheral perfusion.  RESP:  Normal effort. CTA ABD:  No distention.  Soft and nontender.  No rebound, guarding, or rigidity noted.  No organomegaly.  Normal bowel sounds x 4.  No CVA tenderness elicited.   ED Results / Procedures / Treatments   Labs (all labs ordered are listed, but only abnormal results are displayed) Labs Reviewed  BASIC METABOLIC PANEL WITH GFR - Abnormal; Notable for the following components:      Result Value   Glucose, Bld 491 (*)    BUN 25 (*)    All other components within normal limits  CBC - Abnormal; Notable for the following components:  WBC 3.1 (*)    RBC 3.51 (*)    Hemoglobin 10.7 (*)    HCT 31.7 (*)    All other components within normal limits  HEPATIC FUNCTION PANEL  LIPASE, BLOOD  TROPONIN I (HIGH SENSITIVITY)   EKG  Vent. rate 93 BPM PR interval 142 ms QRS duration 64 ms QT/QTcB 358/445 ms P-R-T axes 71 40 57 Normal sinus rhythm No STEMI  RADIOLOGY  I personally viewed and evaluated these images as part of my medical decision making, as well as reviewing the written report by the radiologist.  ED Provider Interpretation: No acute findings  DG Chest 2 View Result Date: 06/06/2024 CLINICAL DATA:  Chest pain. EXAM: CHEST - 2 VIEW COMPARISON:  March 08, 2023. FINDINGS: The heart size and mediastinal contours are within normal limits. Both lungs are clear. The visualized skeletal structures are unremarkable. IMPRESSION: No active cardiopulmonary disease. Electronically Signed   By: Lynwood Landy Raddle M.D.   On: 06/06/2024 14:41   PROCEDURES:  Critical Care  performed: No  Procedures   MEDICATIONS ORDERED IN ED: Medications - No data to display   IMPRESSION / MDM / ASSESSMENT AND PLAN / ED COURSE  I reviewed the triage vital signs and the nursing notes.                              Differential diagnosis includes, but is not limited to, viral gastroenteritis, colitis, diverticulitis electrolyte abnormality, dehydration, hypovolemia  Patient's presentation is most consistent with acute presentation with potential threat to life or bodily function.  Patient's diagnosis is consistent with some transient hypotension and some intermittent loose stools.  Patient with a reassuring Szymon workup at this time.  She presents in no acute distress.  No evidence of dehydration, toxic appearance, or acute respiratory distress.  Her exam is reassuring as it shows no evidence of any acute abdominal process.  Patient with no intractable nausea, vomiting, or diarrhea throughout her course in the ED.  No acute lab abnormalities are noted.  Patient without evidence of dehydration, electrolyte abnormality, or critical anemia.  Chest x-ray images reviewed by me, are negative for any acute intrathoracic process.  No concern for UTI.  No clinical concern for gastroenteritis or colitis.  No evidence of low blood pressure on evaluation and patient with stable orthostatic vital signs here in the ED.  Patient speculates that her symptoms of intermittent loose stools may be due to intake of lactulose, as she has some mild lactulose intolerance.  Patient denied and declined any need or concern for further investigation including CT imaging.  Patient is requesting discharge home at this time.  She is stable and no concern exists for severe dehydration or persistent hypotension.  Patient will be discharged home with instructions to take her home meds. Patient is to follow up with her PCP as scheduled, as needed or otherwise directed. Patient is given ED precautions to return to the  ED for any worsening or new symptoms.   FINAL CLINICAL IMPRESSION(S) / ED DIAGNOSES   Final diagnoses:  Low blood pressure reading  Diarrhea, unspecified type     Rx / DC Orders   ED Discharge Orders     None        Note:  This document was prepared using Dragon voice recognition software and may include unintentional dictation errors.    Loyd Candida LULLA Aldona, PA-C 06/06/24 1700    Dorothyann Drivers, MD  06/06/24 1938  

## 2024-06-06 NOTE — Discharge Instructions (Addendum)
 Your exam, labs, chest x-ray, and EKG are normal and reassuring at this time.  No evidence of consistently low blood pressure during this visit.  Your vital signs remained stable throughout your ED visit.  No persistent diarrhea on exam.  If you feel that your diarrhea is due to intake of milk and dairy products, you should consider taking OTC Lactaid, or avoid dairy products.  No lab indication of dehydration, electrolyte abnormality, or infection.  You should follow-up with your primary provider for ongoing evaluation.

## 2024-06-06 NOTE — ED Triage Notes (Signed)
 Pt comes in via pov by her primary care doctor with concerns of low by, and possible dehydration. Pt has no complaints of pain at this time. Pt states that she has been having diarrhea the past week off and on. Pt with no complaints of abdominal pain. PT is alert and oriented x4 with no signs of acute distress at this time.

## 2024-07-04 ENCOUNTER — Ambulatory Visit: Payer: PRIVATE HEALTH INSURANCE | Admitting: Family Medicine

## 2024-09-05 ENCOUNTER — Encounter: Payer: Self-pay | Admitting: Family Medicine

## 2024-09-05 ENCOUNTER — Ambulatory Visit (INDEPENDENT_AMBULATORY_CARE_PROVIDER_SITE_OTHER): Admitting: Family Medicine

## 2024-09-05 VITALS — BP 106/73 | HR 68 | Temp 97.7°F | Resp 16 | Ht 61.0 in | Wt 119.0 lb

## 2024-09-05 DIAGNOSIS — R5382 Chronic fatigue, unspecified: Secondary | ICD-10-CM | POA: Diagnosis not present

## 2024-09-05 DIAGNOSIS — R8290 Unspecified abnormal findings in urine: Secondary | ICD-10-CM | POA: Diagnosis not present

## 2024-09-05 DIAGNOSIS — N390 Urinary tract infection, site not specified: Secondary | ICD-10-CM | POA: Diagnosis not present

## 2024-09-05 DIAGNOSIS — E871 Hypo-osmolality and hyponatremia: Secondary | ICD-10-CM

## 2024-09-05 LAB — POCT URINALYSIS DIPSTICK
Bilirubin, UA: NEGATIVE
Glucose, UA: POSITIVE — AB
Ketones, UA: NEGATIVE
Nitrite, UA: POSITIVE
Protein, UA: POSITIVE — AB
Spec Grav, UA: 1.01 (ref 1.010–1.025)
Urobilinogen, UA: 0.2 U/dL
pH, UA: 5.5 (ref 5.0–8.0)

## 2024-09-05 MED ORDER — SULFAMETHOXAZOLE-TRIMETHOPRIM 800-160 MG PO TABS
1.0000 | ORAL_TABLET | Freq: Two times a day (BID) | ORAL | 0 refills | Status: DC
Start: 2024-09-05 — End: 2024-09-19

## 2024-09-05 MED ORDER — FLUCONAZOLE 150 MG PO TABS: ORAL_TABLET | ORAL | 0 refills | Status: DC

## 2024-09-05 NOTE — Patient Instructions (Signed)

## 2024-09-05 NOTE — Progress Notes (Signed)
 Acute Care Office Visit  Subjective:   Kristin Cruz [birth year] 09/05/2024  Chief Complaint  Patient presents with   Urinary Tract Infection    ODOR-Also lost 3 lbs fyi     HPI: Discussed the use of AI scribe software for clinical note transcription with the patient, who gave verbal consent to proceed.  History of Present Illness   Kristin Cruz is a 63 year old female who presents with urinary symptoms and fatigue.  She has been experiencing a noticeable odor in her urine for the past three weeks to a month. She reports vaginal itching, dysuria, frequency, and urgency. No fever or chills.  She feels extremely fatigued and lacks energy, with frequent episodes of lightheadedness and dizziness, especially at work, raising concerns about potential falls. She sometimes feels dizzy when going from sitting to standing.  She describes her legs as sore and her muscles as achy. Her feet often feel cold, although they do not change color. She experiences occasional low back pain, which has been present for about two weeks, and sometimes feels abdominal pain, though not severe.     The following portions of the patient's history were reviewed and updated as appropriate: past medical history, past surgical history, family history, social history, allergies, medications, and problem list.   Patient Active Problem List   Diagnosis Date Noted   Uncontrolled diabetes mellitus with hyperglycemia, with long-term current use of insulin  (HCC) 05/02/2024   Hyperlipidemia associated with type 2 diabetes mellitus (HCC) 05/02/2024   Hypotension due to hypovolemia 05/02/2024   Imbalance 09/14/2023   COVID-19 06/29/2023   Polyp of ascending colon 05/23/2023   Encounter for screening colonoscopy 05/23/2023   Numbness and tingling of both feet 04/17/2023   Vaginal candida 04/17/2023   Subclinical hypothyroidism 02/05/2023   Moderate episode of recurrent major depressive disorder  (HCC) 06/02/2022   Hypertrophic toenail 07/15/2020   Nonintractable headache 02/18/2020   Encounter for general adult medical examination with abnormal findings 11/24/2019   Routine cervical smear 11/24/2019   Dysuria 11/24/2019   Pain of left shoulder region 08/26/2019   Screening for breast cancer 07/18/2019   Gastroesophageal reflux disease without esophagitis 07/18/2019   Mixed hyperlipidemia 07/18/2019   Hyperlipidemia due to type 2 diabetes mellitus (HCC) 12/29/2017   Type 2 diabetes mellitus with diabetic polyneuropathy, with long-term current use of insulin  (HCC) 12/29/2017   Vitamin D  deficiency 12/29/2017   Past Medical History:  Diagnosis Date   Depression    Diabetes mellitus without complication (HCC)    GERD (gastroesophageal reflux disease)    High cholesterol    Past Surgical History:  Procedure Laterality Date   COLONOSCOPY     COLONOSCOPY WITH PROPOFOL  N/A 05/23/2023   Procedure: COLONOSCOPY WITH BIOPSY;  Surgeon: Jinny Carmine, MD;  Location[facility];  Service: Endoscopy;  Laterality: N/A;  Diabetic   POLYPECTOMY N/A 05/23/2023   Procedure: POLYPECTOMY;  Surgeon: Jinny Carmine, MD;  Location: [facility];  Service: Endoscopy;  Laterality: N/A;   Family History  Problem Relation Age of Onset   Diabetes Mother    Cancer Mother    Alcohol abuse Son    Cancer Maternal Aunt    Breast cancer Neg Hx    Outpatient Medications Prior to Visit  Medication Sig Dispense Refill   aspirin  81 MG chewable tablet Chew 1 tablet (81 mg total) by mouth daily. 30 tablet 2   Continuous Glucose Sensor (DEXCOM G6 SENSOR) MISC Change sensor  every 10 days. 1 each 3   Continuous Glucose Sensor (FREESTYLE LIBRE 3 SENSOR) MISC Place 1 sensor on the skin every 14 days. Use to check glucose continuously 2 each 3   insulin  degludec (TRESIBA  FLEXTOUCH) 100 UNIT/ML FlexTouch Pen Inject 20 Units into the skin at bedtime. 6 mL 2   Insulin  Pen Needle (PEN NEEDLES) 32G X 4 MM  MISC 1 each by Does not apply route daily. To use with insulin  dosing up to three times daily as needed.  E11.9 100 each 5   rosuvastatin  (CRESTOR ) 10 MG tablet Take 1 tablet (10 mg total) by mouth daily. 90 tablet 3   dapagliflozin  propanediol (FARXIGA ) 10 MG TABS tablet Take 1 tablet (10 mg total) by mouth daily before breakfast. (Patient not taking: Reported on 09/05/2024) 90 tablet 1   No facility-administered medications prior to visit.   No Known Allergies  ROS: A complete ROS was performed with pertinent positives/negatives noted in the HPI. The remainder of the ROS are negative.    Objective:   Today's Vitals   09/05/24 1041  BP: 106/73  Pulse: 68  Resp: 16  Temp: 97.7 F (36.5 C)  TempSrc: Oral  SpO2: 99%  Weight: 119 lb (54 kg)  Height: 5' 1 (1.549 m)  PainSc: 0-No pain    GENERAL: Well-appearing, in NAD. Well nourished.  SKIN: Pink, warm and dry. No rash, lesion, ulceration, or ecchymoses.  Head: Normocephalic. NECK: Trachea midline. Full ROM w/o pain or tenderness. No lymphadenopathy.  EARS: Tympanic membranes are intact, translucent without bulging and without drainage. Appropriate landmarks visualized.  EYES: Conjunctiva clear without exudates. EOMI, PERRL, no drainage present.  NOSE: Septum midline w/o deformity. Nares patent, mucosa pink and non-inflamed w/o drainage. No sinus tenderness.  THROAT: Uvula midline. Oropharynx clear. Tonsils non-inflamed without exudate. Mucous membranes pink and moist.  RESPIRATORY: Chest wall symmetrical. Respirations even and non-labored. Breath sounds clear to auscultation bilaterally.  CARDIAC: S1, S2 present, regular rate and rhythm without murmur or gallops. Peripheral pulses 2+ bilaterally.  MSK: Muscle tone and strength appropriate for age. Joints w/o tenderness, redness, or swelling.  EXTREMITIES: Without clubbing, cyanosis, or edema.  NEUROLOGIC: No motor or sensory deficits. Steady, even gait. C2-C12 intact.   PSYCH/MENTAL STATUS: Alert, oriented x 3. Cooperative, appropriate mood and affect.    Results for orders placed or performed in visit on 09/05/24  POCT Urinalysis Dipstick  Result Value Ref Range   Color, UA light yellow    Clarity, UA cloudy    Glucose, UA Positive (A) Negative   Bilirubin, UA neg    Ketones, UA neg    Spec Grav, UA 1.010 1.010 - 1.025   Blood, UA small    pH, UA 5.5 5.0 - 8.0   Protein, UA Positive (A) Negative   Urobilinogen, UA 0.2 0.2 or 1.0 E.U./dL   Nitrite, UA positive    Leukocytes, UA Small (1+) (A) Negative   Appearance cloudy    Odor some       Assessment & Plan:   1. Abnormal urine odor (Primary) See #4 - POCT Urinalysis Dipstick - Urine Culture  2. Hyponatremia History of hyponatremia. Will check sodium today.  - Sodium  3. Chronic fatigue History of anemia- will repeat CBC with iron study and update patient of results.  - CBC with Differential/Platelet - Fe+TIBC+Fer  4. Acute UTI (urinary tract infection) Urinalysis confirmed UTI with presence of glucose, blood, protein, nitrates, and WBCs. Prescribed antibiotic for UTI. Sent urine for  culture. Adjust antibiotic based on culture results if necessary.  - sulfamethoxazole-trimethoprim (BACTRIM DS) 800-160 MG tablet; Take 1 tablet by mouth 2 (two) times daily for 14 days.  Dispense: 28 tablet; Refill: 0  Meds ordered this encounter  Medications   sulfamethoxazole-trimethoprim (BACTRIM DS) 800-160 MG tablet    Sig: Take 1 tablet by mouth 2 (two) times daily for 14 days.    Dispense:  28 tablet    Refill:  0    Supervising Provider:   ZIGLAR, SUSAN K [[id number]]   Lab Orders         Urine Culture         CBC with Differential/Platelet         Fe+TIBC+Fer         Sodium         POCT Urinalysis Dipstick      Return if symptoms worsen or fail to improve.   Patient to reach out to office if new, worrisome, or unresolved symptoms arise or if no improvement in patient's condition.  Patient verbalized understanding and is agreeable to treatment plan. All questions answered to patient's satisfaction.    Evalene Arts, FNP

## 2024-09-06 LAB — CBC WITH DIFFERENTIAL/PLATELET
Basophils Absolute: 0 x10E3/uL (ref 0.0–0.2)
Basos: 1 %
EOS (ABSOLUTE): 0.1 x10E3/uL (ref 0.0–0.4)
Eos: 2 %
Hematocrit: 39.4 % (ref 34.0–46.6)
Hemoglobin: 12.5 g/dL (ref 11.1–15.9)
Immature Grans (Abs): 0 x10E3/uL (ref 0.0–0.1)
Immature Granulocytes: 0 %
Lymphocytes Absolute: 1.1 x10E3/uL (ref 0.7–3.1)
Lymphs: 24 %
MCH: 29.2 pg (ref 26.6–33.0)
MCHC: 31.7 g/dL (ref 31.5–35.7)
MCV: 92 fL (ref 79–97)
Monocytes Absolute: 0.4 x10E3/uL (ref 0.1–0.9)
Monocytes: 8 %
Neutrophils Absolute: 2.9 x10E3/uL (ref 1.4–7.0)
Neutrophils: 65 %
Platelets: 263 x10E3/uL (ref 150–450)
RBC: 4.28 x10E6/uL (ref 3.77–5.28)
RDW: 11 % — ABNORMAL LOW (ref 11.7–15.4)
WBC: 4.5 x10E3/uL (ref 3.4–10.8)

## 2024-09-06 LAB — IRON,TIBC AND FERRITIN PANEL
Ferritin: 268 ng/mL — ABNORMAL HIGH (ref 15–150)
Iron Saturation: 26 % (ref 15–55)
Iron: 72 ug/dL (ref 27–139)
Total Iron Binding Capacity: 275 ug/dL (ref 250–450)
UIBC: 203 ug/dL (ref 118–369)

## 2024-09-06 LAB — SODIUM: Sodium: 133 mmol/L — ABNORMAL LOW (ref 134–144)

## 2024-09-10 ENCOUNTER — Ambulatory Visit: Payer: Self-pay | Admitting: Family Medicine

## 2024-09-10 DIAGNOSIS — E871 Hypo-osmolality and hyponatremia: Secondary | ICD-10-CM

## 2024-09-10 LAB — URINE CULTURE

## 2024-09-10 MED ORDER — SODIUM CHLORIDE 1 G PO TABS: 1.0000 g | ORAL_TABLET | Freq: Two times a day (BID) | ORAL | 0 refills | Status: DC

## 2024-09-19 ENCOUNTER — Encounter: Payer: Self-pay | Admitting: Family Medicine

## 2024-09-19 ENCOUNTER — Other Ambulatory Visit: Payer: Self-pay

## 2024-09-19 ENCOUNTER — Ambulatory Visit: Admitting: Family Medicine

## 2024-09-19 VITALS — BP 103/65 | HR 69 | Resp 16 | Ht 61.0 in | Wt 128.0 lb

## 2024-09-19 DIAGNOSIS — E1142 Type 2 diabetes mellitus with diabetic polyneuropathy: Secondary | ICD-10-CM

## 2024-09-19 DIAGNOSIS — E1169 Type 2 diabetes mellitus with other specified complication: Secondary | ICD-10-CM

## 2024-09-19 DIAGNOSIS — E785 Hyperlipidemia, unspecified: Secondary | ICD-10-CM

## 2024-09-19 DIAGNOSIS — Z794 Long term (current) use of insulin: Secondary | ICD-10-CM | POA: Diagnosis not present

## 2024-09-19 DIAGNOSIS — F331 Major depressive disorder, recurrent, moderate: Secondary | ICD-10-CM | POA: Diagnosis not present

## 2024-09-19 DIAGNOSIS — E871 Hypo-osmolality and hyponatremia: Secondary | ICD-10-CM

## 2024-09-19 LAB — POCT GLYCOSYLATED HEMOGLOBIN (HGB A1C): Hemoglobin A1C: 11.3 % — AB (ref 4.0–5.6)

## 2024-09-19 MED ORDER — TRESIBA FLEXTOUCH 100 UNIT/ML ~~LOC~~ SOPN
25.0000 [IU] | PEN_INJECTOR | Freq: Every day | SUBCUTANEOUS | 2 refills | Status: AC
  Filled 2024-09-19: qty 6, 24d supply, fill #0

## 2024-09-19 MED ORDER — DAPAGLIFLOZIN PROPANEDIOL 10 MG PO TABS
10.0000 mg | ORAL_TABLET | Freq: Every day | ORAL | 1 refills | Status: AC
  Filled 2024-09-19: qty 30, 30d supply, fill #0

## 2024-09-19 MED ORDER — SODIUM CHLORIDE 1 G PO TABS
1.0000 g | ORAL_TABLET | Freq: Two times a day (BID) | ORAL | 1 refills | Status: AC
  Filled 2024-09-19: qty 60, 30d supply, fill #0

## 2024-09-19 MED ORDER — ROSUVASTATIN CALCIUM 10 MG PO TABS
10.0000 mg | ORAL_TABLET | Freq: Every day | ORAL | 3 refills | Status: AC
  Filled 2024-09-19: qty 90, 90d supply, fill #0

## 2024-09-19 NOTE — Patient Instructions (Addendum)
 [facility] at [facility] [address] [place],  [place]  [address] Main: [phone] Let's try to get Farxiga  through this pharmacy, along with all of your other medications  Please let me know if you have issues with price, so that we can get your A1c under control

## 2024-09-19 NOTE — Progress Notes (Signed)
 Established Patient Office Visit  Subjective  Patient ID: Kristin Cruz, female    DOB: 17-Jul-1961  Age: 63 y.o. MRN: [id number]  Chief Complaint  Patient presents with   Diabetes   DIABETES: Kristin Cruz presents for the medical management of diabetes.  Medication regimen: Tresiba  20 units at bedtime  Denies chest pain, shortness of breath, vision changes, polydipsia, polyphagia, polyuria, open wounds/ulcers on feet. Denies hypoglycemia.  Patient is adhering to a diabetic diet.  Patient is not exercising regularly.  A1c checked today 11.3% Monitoring: blood sugar readings at home: 120 fasting          She works 11P[employer] Well controlled: no  Follow-up: 3 months   ANXIETY: Kristin Cruz presents for the medical management of anxiety.  Current medication regimen:  Counseling:  Well controlled:  Denies SI/HI.      09/19/2024    9:17 AM 09/05/2024   10:52 AM 06/06/2024    9:33 AM 05/02/2024   11:48 AM  GAD 7 : Generalized Anxiety Score  Nervous, Anxious, on Edge 3 3 0 2  Control/stop worrying 3 3 3 2   Worry too much - different things 3 3 3 3   Trouble relaxing 3 3 3 2   Restless 3 0 1 0  Easily annoyed or irritable 3 3 3 2   Afraid - awful might happen 3 3 3 1   Total GAD 7 Score 21 18 16 12   Anxiety Difficulty Not difficult at all  Not difficult at all Somewhat difficult      09/19/2024    9:16 AM 09/05/2024   10:52 AM 06/06/2024    9:30 AM  PHQ9 SCORE ONLY  PHQ-9 Total Score 16 20 9       Data saved with a previous flowsheet row definition    Lab Results  Component Value Date   HGBA1C 11.4 (A) 05/02/2024    No foot exam found Lab Results  Component Value Date   LABMICR 4.1 05/02/2024   LABMICR Comment 11/20/2019   LABMICR 4.1 11/20/2019   MICROALBUR 10 09/29/2022    Wt Readings from Last 3 Encounters:  09/19/24 128 lb (58.1 kg)  09/05/24 119 lb (54 kg)  06/06/24 123 lb 0.3 oz (55.8 kg)   ROS: see HPI     Objective:     BP  103/65   Pulse 69   Resp 16   Ht 5' 1 (1.549 m)   Wt 128 lb (58.1 kg)   SpO2 99%   BMI 24.19 kg/m  BP Readings from Last 3 Encounters:  09/19/24 103/65  09/05/24 106/73  06/06/24 116/69     Physical Exam    Assessment & Plan:   1. Type 2 diabetes mellitus with diabetic polyneuropathy, with long-term current use of insulin  (HCC) (Primary) Chronic. Poor glycemic control. Last A1c was 11.4%, today A1c at 11.3%. Discussed with patient that means her average blood sugars range between 200-300 mg/dL. Declined continuous glucose monitor and reports that she does check her fasting blood sugar. She has been unable to pick up her Farxiga  due to cost. Send prescription to [facility] outpatient pharmacy for potential cost savings. Discussed increasing Tresiba  to 25 units at bedtime. 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 Educated patient to contact office if her fasting blood sugars are elevated at 120 or higher. Follow-up in 3 months.  - POCT HgB A1C - insulin  degludec (TRESIBA  FLEXTOUCH) 100 UNIT/ML FlexTouch Pen; Inject 25  Units into the skin at bedtime.  Dispense: 6 mL; Refill: 2 - dapagliflozin  propanediol (FARXIGA ) 10 MG TABS tablet; Take 1 tablet (10 mg total) by mouth daily before breakfast.  Dispense: 90 tablet; Refill: 1  2. Hyperlipidemia associated with type 2 diabetes mellitus (HCC) Advised patient to continue rosuvastatin  as prescribed. Recent lipid panel is well controlled.  - insulin  degludec (TRESIBA  FLEXTOUCH) 100 UNIT/ML FlexTouch Pen; Inject 25 Units into the skin at bedtime.  Dispense: 6 mL; Refill: 2 - rosuvastatin  (CRESTOR ) 10 MG tablet; Take 1 tablet (10 mg total) by mouth daily.  Dispense: 90 tablet; Refill: 3  3. Moderate episode of recurrent major depressive disorder (HCC) Chronic. Patient reports that she has always dealt with depression with no current medication. She reports she has been on medication in the  past, which made her lethargic. Counseling discussed and potential benefits. Declines pharmacotherapy and counseling at this time. She thinks she may be happier when she moves out of her daughter's home and plans to move to a senior apartment.   4. Hyponatremia Low sodium levels and blood pressure. She reports that she did not know this medication was sent to the pharmacy and she has not started taking them. Sodium tablets prescribed to increase sodium and improve blood pressure. Instruct her to take sodium tablets twice a day with meals. - sodium chloride  1 g tablet; Take 1 tablet (1 g total) by mouth 2 (two) times daily with a meal.  Dispense: 60 tablet; Refill: 1  Return in about 3 months (around 12/20/2024) for chronic conditions .    Evalene Arts, FNP

## 2024-09-28 LAB — SPECIMEN STATUS REPORT

## 2024-09-28 LAB — HGB A1C W/O EAG: Hgb A1c MFr Bld: 12.3 % — ABNORMAL HIGH (ref 4.8–5.6)

## 2024-10-02 ENCOUNTER — Other Ambulatory Visit: Payer: Self-pay

## 2024-11-12 ENCOUNTER — Ambulatory Visit: Payer: Self-pay

## 2024-11-12 NOTE — Telephone Encounter (Signed)
 FYI Only or Action Required?: FYI only for provider: appointment scheduled on 11/13/2024 at 11:00 AM at [facility].  Patient was last seen in primary care on 09/19/2024 by Towana Small, FNP.  Called Nurse Triage reporting Urinary Frequency.  Symptoms began a week ago.  Interventions attempted: Rest, hydration, or home remedies.  Symptoms are: unchanged.  Triage Disposition: See Physician Within 24 Hours  Patient/caregiver understands and will follow disposition?: Yes  Copied from CRM #[id number]. Topic: Clinical - Red Word Triage >> Nov 12, 2024  2:46 PM Alexandria E wrote: [facility] that prompted transfer to Nurse Triage: Potential UTI. Patient is having lower abdomen pain and cloudy urine. Symptoms have been going on for a little over a week. Reason for Disposition  Urinating more frequently than usual (i.e., frequency) OR new-onset of the feeling of an urgent need to urinate (i.e., urgency)  Answer Assessment - Initial Assessment Questions Patient calling with concerns for UTI with symptoms urinary frequency, cloudy urine and lower abdominal pain.  1. SYMPTOM: What's the main symptom you're concerned about? (e.g., frequency, incontinence)     Urinary frequency, cloudy urine 2. ONSET: When did the  urinary frequency and cloudy urine start?     Started a week ago 3. PAIN: Is there any pain? If Yes, ask: How bad is it? (Scale: 1-10; mild, moderate, severe)     7 out of 10 4. CAUSE: What do you think is causing the symptoms?     UTI 5. OTHER SYMPTOMS: Do you have any other symptoms? (e.g., blood in urine, fever, flank pain, pain with urination)     Abdominal pain  Protocols used: Urinary Symptoms-A-AH

## 2024-11-13 ENCOUNTER — Ambulatory Visit: Admitting: Physician Assistant

## 2024-11-13 ENCOUNTER — Other Ambulatory Visit (HOSPITAL_COMMUNITY)
Admission: RE | Admit: 2024-11-13 | Discharge: 2024-11-13 | Disposition: A | Discharge status: Home / Self Care | Source: Ambulatory Visit | Attending: Physician Assistant | Admitting: Physician Assistant

## 2024-11-13 ENCOUNTER — Encounter: Payer: Self-pay | Admitting: Physician Assistant

## 2024-11-13 VITALS — BP 100/62 | HR 83 | Temp 97.8°F | Ht 61.0 in | Wt 123.0 lb

## 2024-11-13 DIAGNOSIS — N898 Other specified noninflammatory disorders of vagina: Secondary | ICD-10-CM | POA: Diagnosis not present

## 2024-11-13 DIAGNOSIS — Z794 Long term (current) use of insulin: Secondary | ICD-10-CM

## 2024-11-13 DIAGNOSIS — E1165 Type 2 diabetes mellitus with hyperglycemia: Secondary | ICD-10-CM

## 2024-11-13 DIAGNOSIS — B3731 Acute candidiasis of vulva and vagina: Secondary | ICD-10-CM

## 2024-11-13 DIAGNOSIS — N3000 Acute cystitis without hematuria: Secondary | ICD-10-CM

## 2024-11-13 LAB — CERVICOVAGINAL ANCILLARY ONLY
Bacterial Vaginitis (gardnerella): NEGATIVE
Candida Glabrata: POSITIVE — AB
Candida Vaginitis: POSITIVE — AB
Comment: NEGATIVE
Comment: NEGATIVE
Comment: NEGATIVE
Comment: NEGATIVE
Trichomonas: NEGATIVE

## 2024-11-13 MED ORDER — FLUCONAZOLE 150 MG PO TABS: 150.0000 mg | ORAL_TABLET | ORAL | 0 refills | Status: AC

## 2024-11-13 MED ORDER — SULFAMETHOXAZOLE-TRIMETHOPRIM 800-160 MG PO TABS: 1.0000 | ORAL_TABLET | Freq: Two times a day (BID) | ORAL | 0 refills | Status: AC

## 2024-11-13 MED ORDER — GLIPIZIDE ER 2.5 MG PO TB24: 2.5000 mg | ORAL_TABLET | Freq: Every day | ORAL | 0 refills | Status: AC

## 2024-11-13 NOTE — Progress Notes (Signed)
 "   Date:  11/13/2024   Name:  Kristin Cruz   DOB:  10/29/1961   MRN:  [id number]   Chief Complaint: Urinary Tract Infection  Urinary Tract Infection  This is a new problem. Episode onset: X1 week. The problem has been unchanged. Quality: itching, discharge white. The pain is at a severity of 0/10. The patient is experiencing no pain. There has been no fever. She is Not sexually active. There is No history of pyelonephritis. Associated symptoms include a discharge, frequency and urgency. She has tried nothing for the symptoms.    Kristin Cruz is a lovely 63 y.o. female with poorly controlled DM2 who typically sees my colleague Evalene Arts, FNP but presents acutely today for probable UTI and yeast infection, with history of similar. Last A1c 11.3% last month. Presently endorses urinary frequency, vaginal itching, and white vaginal discharge. No dysuria or hematuria. Had a culture-positive UTI 10/22 with similar symptoms - Klebsiella which was pansensitive aside from ampicillin  resistance; it was treated with 14 days of Augmentin .   She believes Farxiga  is making the issue worse. She told me she took glimepiride  in the past with potent effect but it caused hypoglycemia.  Medication list has been reviewed and updated.  Active Medications[1]   Review of Systems  Genitourinary:  Positive for frequency and urgency.    Patient Active Problem List   Diagnosis Date Noted   Uncontrolled diabetes mellitus with hyperglycemia, with long-term current use of insulin  (HCC) 05/02/2024   Hyperlipidemia associated with type 2 diabetes mellitus (HCC) 05/02/2024   Hypotension due to hypovolemia 05/02/2024   Imbalance 09/14/2023   COVID-19 06/29/2023   Polyp of ascending colon 05/23/2023   Encounter for screening colonoscopy 05/23/2023   Numbness and tingling of both feet 04/17/2023   Vaginal candida 04/17/2023   Subclinical hypothyroidism 02/05/2023   Moderate episode of recurrent major  depressive disorder (HCC) 06/02/2022   Hypertrophic toenail 07/15/2020   Nonintractable headache 02/18/2020   Encounter for general adult medical examination with abnormal findings 11/24/2019   Routine cervical smear 11/24/2019   Dysuria 11/24/2019   Pain of left shoulder region 08/26/2019   Screening for breast cancer 07/18/2019   Gastroesophageal reflux disease without esophagitis 07/18/2019   Mixed hyperlipidemia 07/18/2019   Hyperlipidemia due to type 2 diabetes mellitus (HCC) 12/29/2017   Type 2 diabetes mellitus with diabetic polyneuropathy, with long-term current use of insulin  (HCC) 12/29/2017   Vitamin D  deficiency 12/29/2017    Allergies[2]  Immunization History  Administered Date(s) Administered   Influenza,inj,Quad PF,6+ Mos 09/29/2022   Influenza,trivalent, recombinat, inj, PF 07/29/2024   Moderna Sars-Covid-2 Vaccination 06/28/2020, 07/26/2020    Past Surgical History:  Procedure Laterality Date   COLONOSCOPY     COLONOSCOPY WITH PROPOFOL  N/A 05/23/2023   Procedure: COLONOSCOPY WITH BIOPSY;  Surgeon: Jinny Carmine, MD;  Location: [facility];  Service: Endoscopy;  Laterality: N/A;  Diabetic   POLYPECTOMY N/A 05/23/2023   Procedure: POLYPECTOMY;  Surgeon: Jinny Carmine, MD;  Location: [facility];  Service: Endoscopy;  Laterality: N/A;    Social History[3]  Family History  Problem Relation Age of Onset   Diabetes Mother    Cancer Mother    Alcohol abuse Son    Cancer Maternal Aunt    Breast cancer Neg Hx         09/19/2024    9:17 AM 09/05/2024   10:52 AM 06/06/2024    9:33 AM 05/02/2024   11:48 AM  GAD 7 : Generalized Anxiety  Score  Nervous, Anxious, on Edge 3 3 0 2  Control/stop worrying 3 3 3 2   Worry too much - different things 3 3 3 3   Trouble relaxing 3 3 3 2   Restless 3 0 1 0  Easily annoyed or irritable 3 3 3 2   Afraid - awful might happen 3 3 3 1   Total GAD 7 Score 21 18 16 12   Anxiety Difficulty Not difficult at all  Not  difficult at all Somewhat difficult       09/19/2024    9:16 AM 09/05/2024   10:52 AM 06/06/2024    9:30 AM  Depression screen PHQ 2/9  Decreased Interest 3 3 1   Down, Depressed, Hopeless 3 3 1   PHQ - 2 Score 6 6 2   Altered sleeping 0 1 0  Tired, decreased energy 3 3 2   Change in appetite 0 2 0  Feeling bad or failure about yourself  3 3 2   Trouble concentrating 3 3 3   Moving slowly or fidgety/restless 0 2 0  Suicidal thoughts 1 0 0  PHQ-9 Score 16  20  9    Difficult doing work/chores Not difficult at all  Not difficult at all     Data saved with a previous flowsheet row definition    BP Readings from Last 3 Encounters:  11/13/24 100/62  09/19/24 103/65  09/05/24 106/73    Wt Readings from Last 3 Encounters:  11/13/24 123 lb (55.8 kg)  09/19/24 128 lb (58.1 kg)  09/05/24 119 lb (54 kg)    BP 100/62   Pulse 83   Temp 97.8 F (36.6 C)   Ht 5' 1 (1.549 m)   Wt 123 lb (55.8 kg)   SpO2 98%   BMI 23.24 kg/m   Physical Exam Vitals and nursing note reviewed.  Constitutional:      Appearance: Normal appearance.  Cardiovascular:     Rate and Rhythm: Normal rate and regular rhythm.     Heart sounds: No murmur heard.    No friction rub. No gallop.  Pulmonary:     Effort: Pulmonary effort is normal.     Breath sounds: Normal breath sounds.  Abdominal:     General: There is no distension.     Tenderness: There is no abdominal tenderness. There is no right CVA tenderness or left CVA tenderness.  Musculoskeletal:        General: Normal range of motion.  Skin:    General: Skin is warm and dry.  Neurological:     Mental Status: She is alert and oriented to person, place, and time.     Gait: Gait is intact.  Psychiatric:        Mood and Affect: Mood and affect normal.     Recent Labs     Component Value Date/Time   NA 133 (L) 09/05/2024 1129   K 3.9 06/06/2024 1412   CL 101 06/06/2024 1412   CO2 24 06/06/2024 1412   GLUCOSE 491 (H) 06/06/2024 1412   BUN 25  (H) 06/06/2024 1412   BUN 19 05/02/2024 1139   CREATININE 0.97 06/06/2024 1412   CALCIUM  9.5 06/06/2024 1412   PROT 7.4 06/06/2024 1412   PROT 6.8 05/02/2024 1139   ALBUMIN 4.0 06/06/2024 1412   ALBUMIN 4.3 05/02/2024 1139   AST 30 06/06/2024 1412   ALT 31 06/06/2024 1412   ALKPHOS 112 06/06/2024 1412   BILITOT 1.1 06/06/2024 1412   BILITOT 0.9 05/02/2024 1139   GFRNONAA >60 06/06/2024  1412   GFRAA 98 12/18/2020 1052    Lab Results  Component Value Date   WBC 4.5 09/05/2024   HGB 12.5 09/05/2024   HCT 39.4 09/05/2024   MCV 92 09/05/2024   PLT 263 09/05/2024   Lab Results  Component Value Date   HGBA1C 11.3 (A) 09/19/2024   HGBA1C 12.3 (H) 09/05/2024   HGBA1C 11.4 (A) 05/02/2024   Lab Results  Component Value Date   CHOL 167 05/02/2024   HDL 56 05/02/2024   LDLCALC 87 05/02/2024   TRIG 136 05/02/2024   CHOLHDL 3.0 05/02/2024   Lab Results  Component Value Date   TSH 1.540 05/02/2024      Assessment and Plan:  1. Acute cystitis without hematuria (Primary) Patient unable to void today despite several attempts.  Seems to be uncomplicated cystitis, although is also possible that her urinary frequency is simply coming from uncontrolled diabetes.  Will treat presumptively with Bactrim  for 5 days.  Follow-up with PCP if not resolving.  - sulfamethoxazole -trimethoprim  (BACTRIM  DS) 800-160 MG tablet; Take 1 tablet by mouth 2 (two) times daily for 5 days.  Dispense: 10 tablet; Refill: 0  2. Vaginal candida Patient advised take fluconazole  q72h for 4 doses. This should treat active candidiasis and also prevent recurrence on the tail end of her Bactrim .   - fluconazole  (DIFLUCAN ) 150 MG tablet; Take 1 tablet (150 mg total) by mouth as directed for 3 days. Take 1 tablet by mouth.  If no improvement after 72 hours, you may repeat dose x1.  Dispense: 4 tablet; Refill: 0  3. Vaginal discharge Self swab completed today to confirm yeast and identify any other potential  irritants.  Results pending. - Cervicovaginal ancillary only  4. Uncontrolled diabetes mellitus with hyperglycemia, with long-term current use of insulin  (HCC) Stop Farxiga  in favor of glipizide XR as below. Despite Beer's list concern for hypoglycemia, very low dose of XR formulation will probably be fine in this 63 y.o. with good renal function. I do not foresee an issue with hypoglycemia in her case with sugars hanging around 300-400. In any event, I told her to stop if any issues and follow up with PCP. Patient is in agreement with plan.  - glipiZIDE (GLUCOTROL XL) 2.5 MG 24 hr tablet; Take 1 tablet (2.5 mg total) by mouth daily with breakfast.  Dispense: 90 tablet; Refill: 0     Rolan Hoyle, PA-C, DMSc, DipACLM, Nutritionist [facility] [phone]      [1]  Current Meds  Medication Sig   aspirin  81 MG chewable tablet Chew 1 tablet (81 mg total) by mouth daily.   dapagliflozin  propanediol (FARXIGA ) 10 MG TABS tablet Take 1 tablet (10 mg total) by mouth daily before breakfast.   fluconazole  (DIFLUCAN ) 150 MG tablet Take 1 tablet (150 mg total) by mouth as directed for 3 days. Take 1 tablet by mouth.  If no improvement after 72 hours, you may repeat dose x1.   glipiZIDE (GLUCOTROL XL) 2.5 MG 24 hr tablet Take 1 tablet (2.5 mg total) by mouth daily with breakfast.   insulin  degludec (TRESIBA  FLEXTOUCH) 100 UNIT/ML FlexTouch Pen Inject 25 Units into the skin at bedtime.   Insulin  Pen Needle (PEN NEEDLES) 32G X 4 MM MISC 1 each by Does not apply route daily. To use with insulin  dosing up to three times daily as needed.  E11.9   rosuvastatin  (CRESTOR ) 10 MG tablet Take 1 tablet (  10 mg total) by mouth daily.   sodium chloride  1 g tablet Take 1 tablet (1 g total) by mouth 2 (two) times daily with a meal.   sulfamethoxazole -trimethoprim  (BACTRIM  DS) 800-160 MG tablet Take 1 tablet by mouth 2 (two) times daily  for 5 days.  [2] No Known Allergies [3]  Social History Tobacco Use   Smoking status: Never    Passive exposure: Never   Smokeless tobacco: Current    Types: Snuff  Vaping Use   Vaping status: Never Used  Substance Use Topics   Alcohol use: Not Currently    Comment: Quit many years ago   Drug use: Yes    Types: Marijuana    Comment: Tried dry   "

## 2024-11-14 ENCOUNTER — Ambulatory Visit: Payer: Self-pay | Admitting: Physician Assistant

## 2024-12-20 ENCOUNTER — Ambulatory Visit: Admitting: Family Medicine
